# Patient Record
Sex: Male | Born: 1950 | Race: White | Hispanic: No | Smoking: Current every day smoker
Health system: Southern US, Community
[De-identification: ages and names within clinical notes are randomized; demographics above are authoritative.]

## PROBLEM LIST (undated history)

## (undated) DIAGNOSIS — M1990 Unspecified osteoarthritis, unspecified site: Secondary | ICD-10-CM

## (undated) DIAGNOSIS — K5903 Drug induced constipation: Secondary | ICD-10-CM

## (undated) HISTORY — PX: COLONOSCOPY W/ POLYPECTOMY: SHX1380

## (undated) HISTORY — PX: FRACTURE SURGERY: SHX138

---

## 1987-05-14 HISTORY — PX: VARICOCELE EXCISION: SUR582

## 2004-03-17 ENCOUNTER — Emergency Department (HOSPITAL_COMMUNITY): Admission: EM | Admit: 2004-03-17 | Discharge: 2004-03-17 | Payer: Self-pay | Admitting: Emergency Medicine

## 2015-01-02 ENCOUNTER — Other Ambulatory Visit: Payer: Self-pay | Admitting: Orthopaedic Surgery

## 2015-01-02 DIAGNOSIS — M25552 Pain in left hip: Secondary | ICD-10-CM

## 2015-01-13 ENCOUNTER — Ambulatory Visit
Admission: RE | Admit: 2015-01-13 | Discharge: 2015-01-13 | Disposition: A | Discharge status: Home / Self Care | Payer: BLUE CROSS/BLUE SHIELD | Source: Ambulatory Visit | Attending: Orthopaedic Surgery | Admitting: Orthopaedic Surgery

## 2015-01-13 DIAGNOSIS — M25552 Pain in left hip: Secondary | ICD-10-CM

## 2015-01-13 MED ORDER — IOHEXOL 180 MG/ML  SOLN
15.0000 mL | Freq: Once | INTRAMUSCULAR | Status: DC | PRN
  Administered 2015-01-13: 12 mL via INTRA_ARTICULAR

## 2015-02-09 NOTE — Pre-Procedure Instructions (Signed)
Garrett Brown.  02/09/2015     Your procedure is scheduled on : Tuesday February 21, 2015 at 10:15 AM.  Report to [facility] Admitting at 8:15 A.M.  Call this number if you have problems the morning of surgery: [phone]    Remember:  Do not eat food or drink liquids after midnight.  Take these medicines the morning of surgery with A SIP OF WATER : Hydrocodone if needed   Stop taking any vitamins, herbal medications, Ibuprofen, Advil, Motrin, Aleve, Naproxen etc on Tuesday October 4th   Do not wear jewelry.  Do not wear lotions, powders, or cologne.    Men may shave face and neck.  Do not bring valuables to the hospital.  [facility] is not responsible for any belongings or valuables.  Contacts, dentures or bridgework may not be worn into surgery.  Leave your suitcase in the car.  After surgery it may be brought to your room.  For patients admitted to the hospital, discharge time will be determined by your treatment team.  Patients discharged the day of surgery will not be allowed to drive home.   Name and phone number of your driver:    Special instructions:  Shower using CHG soap the night before and the morning of your surgery  Please read over the following fact sheets that you were given. Pain Booklet, Coughing and Deep Breathing, Blood Transfusion Information, Total Joint Packet, MRSA Information and Surgical Site Infection Prevention

## 2015-02-10 ENCOUNTER — Encounter (HOSPITAL_COMMUNITY): Payer: Self-pay

## 2015-02-10 ENCOUNTER — Encounter (HOSPITAL_COMMUNITY)
Admission: RE | Admit: 2015-02-10 | Discharge: 2015-02-10 | Disposition: A | Discharge status: Home / Self Care | Payer: BLUE CROSS/BLUE SHIELD | Source: Ambulatory Visit | Attending: Orthopaedic Surgery | Admitting: Orthopaedic Surgery

## 2015-02-10 ENCOUNTER — Encounter (HOSPITAL_COMMUNITY)
Admission: RE | Admit: 2015-02-10 | Discharge: 2015-02-10 | Disposition: A | Discharge status: Home / Self Care | Payer: BLUE CROSS/BLUE SHIELD | Source: Ambulatory Visit | Attending: Orthopedic Surgery | Admitting: Orthopedic Surgery

## 2015-02-10 DIAGNOSIS — M1612 Unilateral primary osteoarthritis, left hip: Secondary | ICD-10-CM | POA: Insufficient documentation

## 2015-02-10 DIAGNOSIS — Z01818 Encounter for other preprocedural examination: Secondary | ICD-10-CM | POA: Insufficient documentation

## 2015-02-10 HISTORY — DX: Drug induced constipation: K59.03

## 2015-02-10 HISTORY — DX: Unspecified osteoarthritis, unspecified site: M19.90

## 2015-02-10 LAB — CBC WITH DIFFERENTIAL/PLATELET
BASOS ABS: 0.1 10*3/uL (ref 0.0–0.1)
BASOS PCT: 0 %
EOS PCT: 2 %
Eosinophils Absolute: 0.3 10*3/uL (ref 0.0–0.7)
HEMATOCRIT: 44.3 % (ref 39.0–52.0)
Hemoglobin: 14.7 g/dL (ref 13.0–17.0)
Lymphocytes Relative: 24 %
Lymphs Abs: 3.2 10*3/uL (ref 0.7–4.0)
MCH: 30.2 pg (ref 26.0–34.0)
MCHC: 33.2 g/dL (ref 30.0–36.0)
MCV: 91.2 fL (ref 78.0–100.0)
MONO ABS: 0.9 10*3/uL (ref 0.1–1.0)
Monocytes Relative: 6 %
NEUTROS ABS: 9.1 10*3/uL — AB (ref 1.7–7.7)
Neutrophils Relative %: 68 %
PLATELETS: 284 10*3/uL (ref 150–400)
RBC: 4.86 MIL/uL (ref 4.22–5.81)
RDW: 14.6 % (ref 11.5–15.5)
WBC: 13.4 10*3/uL — AB (ref 4.0–10.5)

## 2015-02-10 LAB — TYPE AND SCREEN
ABO/RH(D): A POS
ANTIBODY SCREEN: NEGATIVE

## 2015-02-10 LAB — COMPREHENSIVE METABOLIC PANEL
ALBUMIN: 4.3 g/dL (ref 3.5–5.0)
ALT: 15 U/L — AB (ref 17–63)
AST: 14 U/L — AB (ref 15–41)
Alkaline Phosphatase: 54 U/L (ref 38–126)
Anion gap: 9 (ref 5–15)
BUN: 14 mg/dL (ref 6–20)
CHLORIDE: 105 mmol/L (ref 101–111)
CO2: 27 mmol/L (ref 22–32)
Calcium: 9.7 mg/dL (ref 8.9–10.3)
Creatinine, Ser: 0.88 mg/dL (ref 0.61–1.24)
GFR calc Af Amer: >60 mL/min (ref >60)
GFR calc non Af Amer: >60 mL/min (ref >60)
GLUCOSE: 109 mg/dL — AB (ref 65–99)
POTASSIUM: 4.1 mmol/L (ref 3.5–5.1)
Sodium: 141 mmol/L (ref 135–145)
Total Bilirubin: 0.6 mg/dL (ref 0.3–1.2)
Total Protein: 7 g/dL (ref 6.5–8.1)

## 2015-02-10 LAB — SURGICAL PCR SCREEN
MRSA, PCR: NEGATIVE
STAPHYLOCOCCUS AUREUS: NEGATIVE

## 2015-02-10 LAB — PROTIME-INR
INR: 1.12 (ref 0.00–1.49)
Prothrombin Time: 14.6 seconds (ref 11.6–15.2)

## 2015-02-10 LAB — ABO/RH: ABO/RH(D): A POS

## 2015-02-10 LAB — APTT: APTT: 30 s (ref 24–37)

## 2015-02-10 NOTE — Progress Notes (Signed)
Patient informed Nurse that he does not have a PCP  Patient had a stress test a long time ago (greater than five years). Nurse inquired about reasoning for having a stress test, and patient stated "I came to the hospital because I thought I was having a heart attack, and they ran a bunch of tests on me....but it turned out I had some how strained my esophagus.....and man did it hurt!"   Patient denied having any cardiac or pulmonary issues

## 2015-02-16 NOTE — H&P (Signed)
CHIEF COMPLAINT:  Painful left hip.   HISTORY OF PRESENT ILLNESS:  Garrett Brown is a very pleasant 64 year old male who was seen today for evaluation of his left hip.  He first had onset of pain back between late May and early June of left hip pain without any history of injury or trauma.  It had become progressive and he had difficulty with ambulation as well as sleeping.  He was unable to find a comfortable position.  The pain was localized mainly in the superior left groin area and along the lateral aspect of his hip.  At that time he denied any back pain or any thigh, knee, or even leg pain.  He had tried oxycodone that he had from a while ago and that did not really have much help at that time, as well as a muscle relaxer.  He has tried Aleve as well initially.  He was seen on November 28, 2014 and at that time films were obtained revealing about 25% uncoverage of his hip with some degenerative changes of the hip joint.  There was some osteophytes noted both superiorly and inferiorly with possible avascular necrosis.  He did have an MRI scan performed on January 13, 2015 which revealed advanced degenerative changes involving both hips, left greater than right.  There was probable inflammatory arthropathy involving the left hip with severe synovitis.  There was also marked edema like signal abnormality in the surrounding hip and pelvic musculature, maybe associated with myositis or possible muscle injuries.  He was scheduled for a corticosteroid injection to the left hip by Dr. Alvester Morin and that was accomplished on December 12, 2014.  He states that at that time he was better for a short period of time, but by January 17, 2015 the pain had returned and perhaps it was worse.  He has now gotten to the point where he has to use a cane for ambulation.  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  He is now having marked difficulty with sleeping, ambulating,  and just doing activities of daily living.  He is quite limited because of this pain and discomfort.  He is seen today for evaluation.   PAST SURGICAL HISTORY:  A varicocelectomy in the 1980s.   MEDICATIONS:  Hydrocodone p.r.n. and Aleve p.r.n.    ALLERGIES:  None known.   REVIEW OF SYSTEMS:  A 14-point review of systems is unremarkable except for dentures.  He does have shortness of breath with exercise at times.   FAMILY HISTORY:  Positive for a mother who is deceased at age 79 and she had dementia and Alzheimer.  Father died at age 52 from cardiac issues.  He had heart disease and a myocardial infarction in the past as well as strokes.  He did have bladder cancer, but that was not one of the causes of his death.  He does have 2 brothers, age 32 and 54 who are living.   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  He still smokes cigarettes, about 1/2 pack per day and he smoked at maximum 2 packs per day total time of 25 years.  He does still socially drink.   PHYSICAL EXAMINATION:  Reveals a very pleasant 64 year old white male, well developed, well nourished.  Alert, cooperative, in moderate distress secondary to left hip and groin pain.  Height is 5 feet 11 inches,  weight 187, BMI 26.1.   Vital signs reveal a temperature of 97, pulse 72, respirations 16, blood pressure 138/84.   Head:  Normocephalic. Eyes:  Pupils equal, round, and reactive to light and accommodation with extraocular movements intact. Ears, Nose, and Throat:  Benign. Chest:  Good expansion. Lungs:  Essentially clear with decreased breath sounds bilaterally. Cardiac:  Regular rate and rhythm.  Distal heart sounds.  No murmurs noted. Pulses:  Bilaterally 1+ and symmetric in the lower extremities. Abdomen:  Scaphoid, soft, nontender.  No masses palpable.  Normal bowel sounds present. Genital, Rectal, and Breasts:  Not indicated for a normal orthopedic evaluation. CNS:  Oriented x3.   Cranial nerves II-XII grossly intact. Musculoskeletal:  He has very little internal rotation of the hip and only about 10 degrees of external rotation of the left hip.  I can only get him to about 85-90 degrees before he has pain that he cannot move any further in flexion of the hip.     RADIOGRAPHS:  Reveal OA of the left femoral acetabular joint.  He does have some calcification periarticularly.  He does have some cystic changes that may be noted, even in the femoral neck.   CLINICAL IMPRESSION:   1.  End-stage OA of the left hip. 2.  Cigarette smoker.   RECOMMENDATIONS:  At this time, I have reviewed his entire history and it is felt that he is a candidate for a left total hip arthroplasty.  Procedure, risks, and benefits were fully explained to the patient and he is understanding.  Therefore, we will proceed with the total hip replacement in the very near future.  No Changes   [name]. Aleda Grana [facility] [phone]  02/20/2015 3:36 PM

## 2015-02-21 ENCOUNTER — Inpatient Hospital Stay (HOSPITAL_COMMUNITY)
Admission: RE | Admit: 2015-02-21 | Discharge: 2015-02-23 | DRG: 470 | Disposition: A | Discharge status: Home Health | Payer: BLUE CROSS/BLUE SHIELD | Source: Ambulatory Visit | Attending: Orthopaedic Surgery | Admitting: Orthopaedic Surgery

## 2015-02-21 ENCOUNTER — Inpatient Hospital Stay (HOSPITAL_COMMUNITY): Payer: BLUE CROSS/BLUE SHIELD

## 2015-02-21 ENCOUNTER — Encounter (HOSPITAL_COMMUNITY)
Admission: RE | Disposition: A | Discharge status: Home Health | Payer: Self-pay | Source: Ambulatory Visit | Attending: Orthopaedic Surgery

## 2015-02-21 ENCOUNTER — Inpatient Hospital Stay (HOSPITAL_COMMUNITY): Payer: BLUE CROSS/BLUE SHIELD | Admitting: Anesthesiology

## 2015-02-21 ENCOUNTER — Encounter (HOSPITAL_COMMUNITY): Payer: Self-pay | Admitting: *Deleted

## 2015-02-21 DIAGNOSIS — Z96649 Presence of unspecified artificial hip joint: Secondary | ICD-10-CM

## 2015-02-21 DIAGNOSIS — M25552 Pain in left hip: Secondary | ICD-10-CM | POA: Diagnosis present

## 2015-02-21 DIAGNOSIS — F17210 Nicotine dependence, cigarettes, uncomplicated: Secondary | ICD-10-CM | POA: Diagnosis present

## 2015-02-21 DIAGNOSIS — M1612 Unilateral primary osteoarthritis, left hip: Secondary | ICD-10-CM | POA: Diagnosis present

## 2015-02-21 DIAGNOSIS — Z96643 Presence of artificial hip joint, bilateral: Secondary | ICD-10-CM

## 2015-02-21 DIAGNOSIS — Z9889 Other specified postprocedural states: Secondary | ICD-10-CM

## 2015-02-21 HISTORY — PX: TOTAL HIP ARTHROPLASTY: SHX124

## 2015-02-21 LAB — URINALYSIS, ROUTINE W REFLEX MICROSCOPIC
BILIRUBIN URINE: NEGATIVE
Glucose, UA: NEGATIVE mg/dL
HGB URINE DIPSTICK: NEGATIVE
KETONES UR: NEGATIVE mg/dL
Leukocytes, UA: NEGATIVE
NITRITE: NEGATIVE
PH: 7.5 (ref 5.0–8.0)
Protein, ur: NEGATIVE mg/dL
SPECIFIC GRAVITY, URINE: 1.009 (ref 1.005–1.030)
Urobilinogen, UA: 0.2 mg/dL (ref 0.0–1.0)

## 2015-02-21 SURGERY — ARTHROPLASTY, HIP, TOTAL,POSTERIOR APPROACH
Anesthesia: Monitor Anesthesia Care | Site: Hip | Laterality: Left

## 2015-02-21 MED ORDER — OXYCODONE HCL 5 MG PO TABS
ORAL_TABLET | ORAL | Status: AC
  Filled 2015-02-21: qty 1

## 2015-02-21 MED ORDER — STERILE WATER FOR INJECTION IJ SOLN
INTRAMUSCULAR | Status: AC
  Filled 2015-02-21: qty 10

## 2015-02-21 MED ORDER — LACTATED RINGERS IV SOLN
INTRAVENOUS | Status: DC | PRN
  Administered 2015-02-21 (×2): via INTRAVENOUS

## 2015-02-21 MED ORDER — BUPIVACAINE-EPINEPHRINE (PF) 0.25% -1:200000 IJ SOLN
INTRAMUSCULAR | Status: AC
  Filled 2015-02-21: qty 30

## 2015-02-21 MED ORDER — HYDROMORPHONE HCL 1 MG/ML IJ SOLN
0.5000 mg | INTRAMUSCULAR | Status: DC | PRN
  Administered 2015-02-21 – 2015-02-22 (×4): 1 mg via INTRAVENOUS
  Filled 2015-02-21 (×4): qty 1

## 2015-02-21 MED ORDER — TRANEXAMIC ACID 1000 MG/10ML IV SOLN
2000.0000 mg | INTRAVENOUS | Status: DC | PRN
  Administered 2015-02-21: 2000 mg via TOPICAL

## 2015-02-21 MED ORDER — CEFAZOLIN SODIUM-DEXTROSE 2-3 GM-% IV SOLR
2.0000 g | Freq: Four times a day (QID) | INTRAVENOUS | Status: AC
  Administered 2015-02-21: 2 g via INTRAVENOUS
  Filled 2015-02-21 (×2): qty 50

## 2015-02-21 MED ORDER — CHLORHEXIDINE GLUCONATE 4 % EX LIQD: 60.0000 mL | Freq: Once | CUTANEOUS | Status: DC

## 2015-02-21 MED ORDER — HYDROMORPHONE HCL 1 MG/ML IJ SOLN
0.2500 mg | INTRAMUSCULAR | Status: DC | PRN
  Administered 2015-02-21 (×4): 0.5 mg via INTRAVENOUS

## 2015-02-21 MED ORDER — MIDAZOLAM HCL 2 MG/2ML IJ SOLN
INTRAMUSCULAR | Status: AC
  Filled 2015-02-21: qty 4

## 2015-02-21 MED ORDER — LIDOCAINE HCL (CARDIAC) 20 MG/ML IV SOLN
INTRAVENOUS | Status: AC
  Filled 2015-02-21: qty 10

## 2015-02-21 MED ORDER — ONDANSETRON HCL 4 MG/2ML IJ SOLN
INTRAMUSCULAR | Status: AC
  Filled 2015-02-21: qty 2

## 2015-02-21 MED ORDER — ACETAMINOPHEN 10 MG/ML IV SOLN
1000.0000 mg | Freq: Once | INTRAVENOUS | Status: AC
  Administered 2015-02-21: 1000 mg via INTRAVENOUS
  Filled 2015-02-21: qty 100

## 2015-02-21 MED ORDER — CEFAZOLIN SODIUM-DEXTROSE 2-3 GM-% IV SOLR
2.0000 g | INTRAVENOUS | Status: AC
  Administered 2015-02-21: 2 g via INTRAVENOUS

## 2015-02-21 MED ORDER — OXYCODONE HCL 5 MG PO TABS
5.0000 mg | ORAL_TABLET | ORAL | Status: DC | PRN
  Administered 2015-02-21 – 2015-02-23 (×7): 10 mg via ORAL
  Filled 2015-02-21 (×7): qty 2

## 2015-02-21 MED ORDER — FENTANYL CITRATE (PF) 250 MCG/5ML IJ SOLN
INTRAMUSCULAR | Status: AC
  Filled 2015-02-21: qty 5

## 2015-02-21 MED ORDER — TRANEXAMIC ACID 1000 MG/10ML IV SOLN
2000.0000 mg | INTRAVENOUS | Status: DC
  Filled 2015-02-21: qty 20

## 2015-02-21 MED ORDER — DIPHENHYDRAMINE HCL 12.5 MG/5ML PO ELIX: 12.5000 mg | ORAL_SOLUTION | ORAL | Status: DC | PRN

## 2015-02-21 MED ORDER — MIDAZOLAM HCL 5 MG/5ML IJ SOLN
INTRAMUSCULAR | Status: DC | PRN
  Administered 2015-02-21: 2 mg via INTRAVENOUS

## 2015-02-21 MED ORDER — ALBUMIN HUMAN 5 % IV SOLN
INTRAVENOUS | Status: DC | PRN
  Administered 2015-02-21: 12:00:00 via INTRAVENOUS

## 2015-02-21 MED ORDER — LACTATED RINGERS IV SOLN
INTRAVENOUS | Status: DC
  Administered 2015-02-21: 08:00:00 via INTRAVENOUS

## 2015-02-21 MED ORDER — BUPIVACAINE-EPINEPHRINE (PF) 0.25% -1:200000 IJ SOLN
INTRAMUSCULAR | Status: DC | PRN
  Administered 2015-02-21: 20 mL

## 2015-02-21 MED ORDER — KETOROLAC TROMETHAMINE 15 MG/ML IJ SOLN
15.0000 mg | Freq: Four times a day (QID) | INTRAMUSCULAR | Status: AC
  Administered 2015-02-21 – 2015-02-22 (×3): 15 mg via INTRAVENOUS
  Filled 2015-02-21 (×3): qty 1

## 2015-02-21 MED ORDER — OXYCODONE HCL 5 MG PO TABS
5.0000 mg | ORAL_TABLET | Freq: Once | ORAL | Status: AC | PRN
  Administered 2015-02-21: 5 mg via ORAL

## 2015-02-21 MED ORDER — RIVAROXABAN 10 MG PO TABS
10.0000 mg | ORAL_TABLET | Freq: Every day | ORAL | Status: DC
  Administered 2015-02-22 – 2015-02-23 (×2): 10 mg via ORAL
  Filled 2015-02-21 (×2): qty 1

## 2015-02-21 MED ORDER — POLYETHYLENE GLYCOL 3350 17 G PO PACK: 17.0000 g | PACK | Freq: Every day | ORAL | Status: DC | PRN

## 2015-02-21 MED ORDER — FENTANYL CITRATE (PF) 100 MCG/2ML IJ SOLN
INTRAMUSCULAR | Status: DC | PRN
  Administered 2015-02-21 (×3): 50 ug via INTRAVENOUS
  Administered 2015-02-21: 100 ug via INTRAVENOUS

## 2015-02-21 MED ORDER — ONDANSETRON HCL 4 MG PO TABS: 4.0000 mg | ORAL_TABLET | Freq: Four times a day (QID) | ORAL | Status: DC | PRN

## 2015-02-21 MED ORDER — EPHEDRINE SULFATE 50 MG/ML IJ SOLN
INTRAMUSCULAR | Status: DC | PRN
  Administered 2015-02-21: 5 mg via INTRAVENOUS
  Administered 2015-02-21 (×2): 10 mg via INTRAVENOUS
  Administered 2015-02-21: 5 mg via INTRAVENOUS

## 2015-02-21 MED ORDER — ARTIFICIAL TEARS OP OINT
TOPICAL_OINTMENT | OPHTHALMIC | Status: AC
  Filled 2015-02-21: qty 3.5

## 2015-02-21 MED ORDER — KETOROLAC TROMETHAMINE 15 MG/ML IJ SOLN
INTRAMUSCULAR | Status: AC
  Filled 2015-02-21: qty 1

## 2015-02-21 MED ORDER — SODIUM CHLORIDE 0.9 % IR SOLN
Status: DC | PRN
  Administered 2015-02-21: 1000 mL

## 2015-02-21 MED ORDER — SODIUM CHLORIDE 0.9 % IV SOLN
75.0000 mL/h | INTRAVENOUS | Status: DC
Start: 2015-02-21 — End: 2015-02-22
  Administered 2015-02-21 – 2015-02-22 (×2): 75 mL/h via INTRAVENOUS

## 2015-02-21 MED ORDER — PHENOL 1.4 % MT LIQD: 1.0000 | OROMUCOSAL | Status: DC | PRN

## 2015-02-21 MED ORDER — EPHEDRINE SULFATE 50 MG/ML IJ SOLN
INTRAMUSCULAR | Status: AC
  Filled 2015-02-21: qty 2

## 2015-02-21 MED ORDER — METOCLOPRAMIDE HCL 5 MG/ML IJ SOLN: 5.0000 mg | Freq: Three times a day (TID) | INTRAMUSCULAR | Status: DC | PRN

## 2015-02-21 MED ORDER — METHOCARBAMOL 500 MG PO TABS
500.0000 mg | ORAL_TABLET | Freq: Four times a day (QID) | ORAL | Status: DC | PRN
  Administered 2015-02-21: 500 mg via ORAL

## 2015-02-21 MED ORDER — SODIUM CHLORIDE 0.9 % IJ SOLN
INTRAMUSCULAR | Status: AC
  Filled 2015-02-21: qty 20

## 2015-02-21 MED ORDER — DOCUSATE SODIUM 100 MG PO CAPS
100.0000 mg | ORAL_CAPSULE | Freq: Two times a day (BID) | ORAL | Status: DC
  Administered 2015-02-21 – 2015-02-23 (×4): 100 mg via ORAL
  Filled 2015-02-21 (×4): qty 1

## 2015-02-21 MED ORDER — PROPOFOL 500 MG/50ML IV EMUL
INTRAVENOUS | Status: DC | PRN
  Administered 2015-02-21: 50 ug/kg/min via INTRAVENOUS

## 2015-02-21 MED ORDER — BISACODYL 10 MG RE SUPP: 10.0000 mg | Freq: Every day | RECTAL | Status: DC | PRN

## 2015-02-21 MED ORDER — VECURONIUM BROMIDE 10 MG IV SOLR
INTRAVENOUS | Status: AC
  Filled 2015-02-21: qty 10

## 2015-02-21 MED ORDER — METOCLOPRAMIDE HCL 5 MG PO TABS: 5.0000 mg | ORAL_TABLET | Freq: Three times a day (TID) | ORAL | Status: DC | PRN

## 2015-02-21 MED ORDER — MENTHOL 3 MG MT LOZG: 1.0000 | LOZENGE | OROMUCOSAL | Status: DC | PRN

## 2015-02-21 MED ORDER — MAGNESIUM CITRATE PO SOLN: 1.0000 | Freq: Once | ORAL | Status: DC | PRN

## 2015-02-21 MED ORDER — LIDOCAINE HCL (CARDIAC) 20 MG/ML IV SOLN
INTRAVENOUS | Status: DC | PRN
  Administered 2015-02-21: 50 mg via INTRAVENOUS

## 2015-02-21 MED ORDER — ALUM & MAG HYDROXIDE-SIMETH 200-200-20 MG/5ML PO SUSP: 30.0000 mL | ORAL | Status: DC | PRN

## 2015-02-21 MED ORDER — SODIUM CHLORIDE 0.9 % IV SOLN: INTRAVENOUS | Status: DC

## 2015-02-21 MED ORDER — ONDANSETRON HCL 4 MG/2ML IJ SOLN: 4.0000 mg | Freq: Once | INTRAMUSCULAR | Status: DC | PRN

## 2015-02-21 MED ORDER — ROCURONIUM BROMIDE 50 MG/5ML IV SOLN
INTRAVENOUS | Status: AC
  Filled 2015-02-21: qty 1

## 2015-02-21 MED ORDER — METHOCARBAMOL 500 MG PO TABS
ORAL_TABLET | ORAL | Status: AC
Start: 2015-02-21 — End: 2015-02-22
  Filled 2015-02-21: qty 1

## 2015-02-21 MED ORDER — METHOCARBAMOL 1000 MG/10ML IJ SOLN
500.0000 mg | Freq: Four times a day (QID) | INTRAVENOUS | Status: DC | PRN
  Filled 2015-02-21: qty 5

## 2015-02-21 MED ORDER — ONDANSETRON HCL 4 MG/2ML IJ SOLN: 4.0000 mg | Freq: Four times a day (QID) | INTRAMUSCULAR | Status: DC | PRN

## 2015-02-21 MED ORDER — HYDROMORPHONE HCL 1 MG/ML IJ SOLN
INTRAMUSCULAR | Status: AC
  Filled 2015-02-21: qty 1

## 2015-02-21 MED ORDER — CEFAZOLIN SODIUM-DEXTROSE 2-3 GM-% IV SOLR
INTRAVENOUS | Status: AC
  Filled 2015-02-21: qty 50

## 2015-02-21 MED ORDER — ACETAMINOPHEN 10 MG/ML IV SOLN
1000.0000 mg | Freq: Four times a day (QID) | INTRAVENOUS | Status: AC
  Administered 2015-02-21 – 2015-02-22 (×4): 1000 mg via INTRAVENOUS
  Filled 2015-02-21 (×4): qty 100

## 2015-02-21 MED ORDER — HYDROMORPHONE HCL 1 MG/ML IJ SOLN
INTRAMUSCULAR | Status: AC
Start: 2015-02-21 — End: 2015-02-22
  Filled 2015-02-21: qty 1

## 2015-02-21 MED ORDER — OXYCODONE HCL 5 MG/5ML PO SOLN: 5.0000 mg | Freq: Once | ORAL | Status: AC | PRN

## 2015-02-21 SURGICAL SUPPLY — 54 items
BLADE SAW SAG 73X25 THK (BLADE) ×2
BLADE SAW SGTL 73X25 THK (BLADE) ×1 IMPLANT
BRUSH FEMORAL CANAL (MISCELLANEOUS) IMPLANT
CAPT HIP TOTAL 2 ×2 IMPLANT
COVER SURGICAL LIGHT HANDLE (MISCELLANEOUS) ×3 IMPLANT
DRAPE INCISE IOBAN 66X45 STRL (DRAPES) IMPLANT
DRAPE ORTHO SPLIT 77X108 STRL (DRAPES) ×6
DRAPE SURG ORHT 6 SPLT 77X108 (DRAPES) ×2 IMPLANT
DRSG MEPILEX BORDER 4X12 (GAUZE/BANDAGES/DRESSINGS) ×3 IMPLANT
DURAPREP 26ML APPLICATOR (WOUND CARE) ×6 IMPLANT
ELECT BLADE 6.5 EXT (BLADE) IMPLANT
ELECT REM PT RETURN 9FT ADLT (ELECTROSURGICAL) ×3
ELECTRODE REM PT RTRN 9FT ADLT (ELECTROSURGICAL) ×1 IMPLANT
EVACUATOR 1/8 PVC DRAIN (DRAIN) IMPLANT
FACESHIELD WRAPAROUND (MASK) ×6 IMPLANT
FACESHIELD WRAPAROUND OR TEAM (MASK) ×3 IMPLANT
GLOVE BIOGEL PI IND STRL 8 (GLOVE) ×2 IMPLANT
GLOVE BIOGEL PI IND STRL 8.5 (GLOVE) ×1 IMPLANT
GLOVE BIOGEL PI INDICATOR 8 (GLOVE) ×4
GLOVE BIOGEL PI INDICATOR 8.5 (GLOVE) ×2
GLOVE ECLIPSE 8.0 STRL XLNG CF (GLOVE) ×6 IMPLANT
GLOVE SURG ORTHO 8.5 STRL (GLOVE) ×6 IMPLANT
GOWN STRL REUS W/ TWL LRG LVL3 (GOWN DISPOSABLE) ×2 IMPLANT
GOWN STRL REUS W/TWL 2XL LVL3 (GOWN DISPOSABLE) ×6 IMPLANT
GOWN STRL REUS W/TWL LRG LVL3 (GOWN DISPOSABLE) ×6
HANDPIECE INTERPULSE COAX TIP (DISPOSABLE)
IMMOBILIZER KNEE 20 (SOFTGOODS) IMPLANT
IMMOBILIZER KNEE 22 UNIV (SOFTGOODS) ×3 IMPLANT
KIT BASIN OR (CUSTOM PROCEDURE TRAY) ×3 IMPLANT
KIT ROOM TURNOVER OR (KITS) ×3 IMPLANT
MANIFOLD NEPTUNE II (INSTRUMENTS) ×3 IMPLANT
NEEDLE 22X1 1/2 (OR ONLY) (NEEDLE) ×3 IMPLANT
NS IRRIG 1000ML POUR BTL (IV SOLUTION) ×3 IMPLANT
PACK TOTAL JOINT (CUSTOM PROCEDURE TRAY) ×3 IMPLANT
PACK UNIVERSAL I (CUSTOM PROCEDURE TRAY) ×1 IMPLANT
PAD ARMBOARD 7.5X6 YLW CONV (MISCELLANEOUS) ×3 IMPLANT
PRESSURIZER FEMORAL UNIV (MISCELLANEOUS) IMPLANT
SET HNDPC FAN SPRY TIP SCT (DISPOSABLE) IMPLANT
STAPLER VISISTAT 35W (STAPLE) ×2 IMPLANT
SUCTION FRAZIER TIP 10 FR DISP (SUCTIONS) ×3 IMPLANT
SUT BONE WAX W31G (SUTURE) IMPLANT
SUT ETHIBOND NAB CT1 #1 30IN (SUTURE) ×9 IMPLANT
SUT MNCRL AB 3-0 PS2 18 (SUTURE) ×5 IMPLANT
SUT VIC AB 0 CT1 27 (SUTURE) ×6
SUT VIC AB 0 CT1 27XBRD ANBCTR (SUTURE) ×2 IMPLANT
SUT VIC AB 1 CT1 27 (SUTURE) ×12
SUT VIC AB 1 CT1 27XBRD ANBCTR (SUTURE) ×2 IMPLANT
SUT VIC AB 2-0 CT1 27 (SUTURE) ×3
SUT VIC AB 2-0 CT1 TAPERPNT 27 (SUTURE) ×1 IMPLANT
SYR CONTROL 10ML LL (SYRINGE) ×3 IMPLANT
TOWEL OR 17X24 6PK STRL BLUE (TOWEL DISPOSABLE) ×3 IMPLANT
TOWEL OR 17X26 10 PK STRL BLUE (TOWEL DISPOSABLE) ×3 IMPLANT
TOWER CARTRIDGE SMART MIX (DISPOSABLE) IMPLANT
WRAP KNEE MAXI GEL POST OP (GAUZE/BANDAGES/DRESSINGS) ×2 IMPLANT

## 2015-02-21 NOTE — Op Note (Signed)
PATIENT ID:      Garrett Brown.  MRN:     [id number] DOB/AGE:    December 10, 1950 / 64 y.o.       OPERATIVE REPORT    DATE OF PROCEDURE:  02/21/2015       PREOPERATIVE DIAGNOSIS:PRIMARY, END STAGE   LEFT HIP OSTEOARTHRITIS                                                       Estimated body mass index is 26.02 kg/(m^2) as calculated from the following:   Height as of this encounter: 5' 10.5" (1.791 m).   Weight as of this encounter: 83.462 kg (184 lb).     POSTOPERATIVE DIAGNOSIS:   LEFT HIP OSTEOARTHRITIS-SAME                                                                     Estimated body mass index is 26.02 kg/(m^2) as calculated from the following:   Height as of this encounter: 5' 10.5" (1.791 m).   Weight as of this encounter: 83.462 kg (184 lb).     PROCEDURE:  Procedure(s):LEFT TOTAL LEFT HIP ARTHROPLASTY     SURGEON:  Norlene Campbell, MD    ASSISTANT:   Jacqualine Code, PA-C   (Present and scrubbed throughout the case, critical for assistance with exposure, retraction, instrumentation, and closure.)          ANESTHESIA: spinal and IV sedation     DRAINS: none :      TOURNIQUET TIME: * No tourniquets in log *    COMPLICATIONS:  None   CONDITION:  stable  PROCEDURE IN DETAIL: [id number]   Garrett Brown W 02/21/2015, 12:16 PM

## 2015-02-21 NOTE — Anesthesia Procedure Notes (Addendum)
Procedure Name: MAC Date/Time: 02/21/2015 10:20 AM Performed by: Wray Kearns A Pre-anesthesia Checklist: Patient identified, Timeout performed, Emergency Drugs available, Suction available and Patient being monitored Patient Re-evaluated:Patient Re-evaluated prior to inductionOxygen Delivery Method: Nasal cannula Intubation Type: IV induction Placement Confirmation: positive ETCO2 Dental Injury: Teeth and Oropharynx as per pre-operative assessment    Spinal Patient location during procedure: OR Start time: 02/21/2015 10:40 AM End time: 02/21/2015 10:45 AM Staffing Performed by: anesthesiologist  Preanesthetic Checklist Completed: patient identified, site marked, surgical consent, pre-op evaluation, timeout performed, IV checked and monitors and equipment checked Spinal Block Patient position: right lateral decubitus Prep: ChloraPrep Patient monitoring: heart rate, cardiac monitor, continuous pulse ox and blood pressure Approach: right paramedian Location: L3-4 Injection technique: single-shot Needle Needle type: Tuohy  Needle gauge: 22 G Needle length: 9 cm Assessment Sensory level: T6 Additional Notes 10 mg 0.75% Bupivacaine injected easily

## 2015-02-21 NOTE — Anesthesia Preprocedure Evaluation (Signed)
Anesthesia Evaluation  Patient identified by MRN, date of birth, ID band Patient awake    Reviewed: Allergy & Precautions, NPO status , Patient's Chart, lab work & pertinent test results  Airway Mallampati: II  TM Distance: >3 FB Neck ROM: Full    Dental  (+) Edentulous Upper, Partial Lower, Dental Advisory Given   Pulmonary Current Smoker,     + decreased breath sounds      Cardiovascular  Rhythm:Regular Rate:Normal     Neuro/Psych    GI/Hepatic   Endo/Other    Renal/GU      Musculoskeletal   Abdominal   Peds  Hematology   Anesthesia Other Findings   Reproductive/Obstetrics                             Anesthesia Physical Anesthesia Plan  ASA: II  Anesthesia Plan: MAC and Spinal   Post-op Pain Management:    Induction: Intravenous  Airway Management Planned: Natural Airway and Simple Face Mask  Additional Equipment:   Intra-op Plan:   Post-operative Plan:   Informed Consent: I have reviewed the patients History and Physical, chart, labs and discussed the procedure including the risks, benefits and alternatives for the proposed anesthesia with the patient or authorized representative who has indicated his/her understanding and acceptance.   Dental advisory given  Plan Discussed with: CRNA and Anesthesiologist  Anesthesia Plan Comments: (DJD L. Hip Smoker/COPD  Plan SAB)        Anesthesia Quick Evaluation

## 2015-02-21 NOTE — Anesthesia Postprocedure Evaluation (Signed)
  Anesthesia Post-op Note  Patient: Garrett Brown.  Procedure(s) Performed: Procedure(s): TOTAL LEFT HIP ARTHROPLASTY (Left)  Patient Location: PACU  Anesthesia Type:Spinal  Level of Consciousness: awake, alert  and oriented  Airway and Oxygen Therapy: Patient Spontanous Breathing and Patient connected to nasal cannula oxygen  Post-op Pain: none  Post-op Assessment: Post-op Vital signs reviewed, Patient's Cardiovascular Status Stable, Respiratory Function Stable and Pain level controlled LLE Motor Response: Purposeful movement, Responds to commands LLE Sensation: Full sensation RLE Motor Response: Purposeful movement, Responds to commands RLE Sensation: Full sensation L Sensory Level: S1-Sole of foot, small toes R Sensory Level: S1-Sole of foot, small toes  Post-op Vital Signs: stable  Last Vitals:  Filed Vitals:   02/21/15 1345  BP:   Pulse: 60  Temp:   Resp: 21    Complications: No apparent anesthesia complications

## 2015-02-21 NOTE — Progress Notes (Signed)
Care of pt assumed by MA Collene Mares RN from Devota Pace RN

## 2015-02-21 NOTE — Care Management (Signed)
Utilization review completed. Narelle Schoening, RN Case Manager [phone]. 

## 2015-02-21 NOTE — Addendum Note (Signed)
Addendum  created 02/21/15 1358 by Elisabeth Most, CRNA   Modules edited: Anesthesia Medication Administration

## 2015-02-21 NOTE — Progress Notes (Signed)
Report received from shirley gregson rn as caregiver

## 2015-02-21 NOTE — H&P (Signed)
  The recent History & Physical has been reviewed. I have personally examined the patient today. There is no interval change to the documented History & Physical. The patient would like to proceed with the procedure.  Norlene Campbell W 02/21/2015,  9:46 AM

## 2015-02-21 NOTE — Transfer of Care (Signed)
Immediate Anesthesia Transfer of Care Note  Patient: Garrett Brown.  Procedure(s) Performed: Procedure(s): TOTAL LEFT HIP ARTHROPLASTY (Left)  Patient Location: PACU  Anesthesia Type:Spinal  Level of Consciousness: awake, alert , oriented, patient cooperative and responds to stimulation  Airway & Oxygen Therapy: Patient Spontanous Breathing  Post-op Assessment: Report given to RN, Post -op Vital signs reviewed and stable, Patient moving all extremities and Patient moving all extremities X 4  Post vital signs: Reviewed and stable  Last Vitals:  Filed Vitals:   02/21/15 0758  BP: 147/90  Pulse: 70  Temp: 36.4 C  Resp: 18    Complications: No apparent anesthesia complications

## 2015-02-21 NOTE — Evaluation (Addendum)
Physical Therapy Evaluation Patient Details Name: Garrett Brown. MRN: [id number] DOB: 02-23-1951 Today's Date: 02/21/2015   History of Present Illness  Patient is a 64 y/o male s/p left THA, posterior approach. No significant PMH.  Clinical Impression  Patient presents with pain and post surgical deficits LLE s/p L THA. Tolerated short distance ambulation with Min guard assist for safety. Educated pt on posterior hip precautions. Instructed pt in exercises. Pt's brother plans to stay with him until Sunday. Will follow acutely to maximize independence and mobility prior to return home.     Follow Up Recommendations Home health PT;Supervision/Assistance - 24 hour    Equipment Recommendations  None recommended by PT    Recommendations for Other Services OT consult     Precautions / Restrictions Precautions Precautions: Posterior Hip Precaution Booklet Issued: No Precaution Comments: Reviewed precautions. Required Braces or Orthoses: Knee Immobilizer - Left Restrictions Weight Bearing Restrictions: Yes Other Position/Activity Restrictions: WBAT LLE      Mobility  Bed Mobility Overal bed mobility: Needs Assistance Bed Mobility: Supine to Sit     Supine to sit: Supervision;HOB elevated     General bed mobility comments: use of rail for support. + dizziness - resolved.  Transfers Overall transfer level: Needs assistance Equipment used: Rolling walker (2 wheeled) Transfers: Sit to/from Stand Sit to Stand: Min guard         General transfer comment: Min guard to boost from EOB with cues for hand placement. Transferred to chair post ambulation bout.  Ambulation/Gait Ambulation/Gait assistance: Min guard Ambulation Distance (Feet): 8 Feet Assistive device: Rolling walker (2 wheeled) Gait Pattern/deviations: Step-to pattern;Decreased stance time - left;Decreased step length - right Gait velocity: decreased   General Gait Details: Slow, steady gait. Cues for RW  management.  Stairs            Wheelchair Mobility    Modified Rankin (Stroke Patients Only)       Balance Overall balance assessment: Needs assistance Sitting-balance support: Feet supported;No upper extremity supported Sitting balance-Leahy Scale: Good     Standing balance support: During functional activity Standing balance-Leahy Scale: Poor Standing balance comment: Relient on RW for support.                             Pertinent Vitals/Pain Pain Assessment: Faces Faces Pain Scale: Hurts even more Pain Location: left hip Pain Descriptors / Indicators: Sore Pain Intervention(s): Monitored during session;Repositioned    Home Living Family/patient expects to be discharged to:: Private residence Living Arrangements: Alone Available Help at Discharge: Family;Available PRN/intermittently (Brother will be staying with him until Sunday) Type of Home: House Home Access: Stairs to enter Entrance Stairs-Rails: None Entrance Stairs-Number of Steps: 2 Home Layout: Two level (Able to live on main level - ordered a hospital bed?) Home Equipment: Cane - single point;Bedside commode;Walker - 2 wheels      Prior Function Level of Independence: Independent with assistive device(s)         Comments: 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     Hand Dominance        Extremity/Trunk Assessment               Lower Extremity Assessment: LLE deficits/detail   LLE Deficits / Details: Limited AROM/strength secondary to pain and post op.     Communication   Communication: No difficulties  Cognition Arousal/Alertness: Awake/alert Behavior During Therapy: WFL for tasks assessed/performed Overall Cognitive  Status: Within Functional Limits for tasks assessed                      General Comments      Exercises Total Joint Exercises Ankle Circles/Pumps: Both;10 reps;Supine Quad Sets: Both;10 reps;Supine Gluteal Sets: Both;10  reps;Supine      Assessment/Plan    PT Assessment Patient needs continued PT services  PT Diagnosis Difficulty walking;Acute pain   PT Problem List Decreased strength;Pain;Decreased activity tolerance;Decreased balance;Decreased mobility;Decreased knowledge of precautions  PT Treatment Interventions Balance training;Gait training;Stair training;Functional mobility training;Therapeutic activities;Therapeutic exercise;Patient/family education   PT Goals (Current goals can be found in the Care Plan section) Acute Rehab PT Goals Patient Stated Goal: none stated PT Goal Formulation: With patient Time For Goal Achievement: 03/07/15 Potential to Achieve Goals: Fair    Frequency 7X/week   Barriers to discharge Decreased caregiver support Pt lives alone    Co-evaluation               End of Session Equipment Utilized During Treatment: Gait belt Activity Tolerance: Patient tolerated treatment well Patient left: in chair;with call bell/phone within reach Nurse Communication: Mobility status;Precautions         Time: 1610-9604 PT Time Calculation (min) (ACUTE ONLY): 16 min   Charges:   PT Evaluation $Initial PT Evaluation Tier I: 1 Procedure     PT G Codes:        Lendell Gallick A Nariya Neumeyer 02/21/2015, 5:05 PM  Mylo Red, PT, DPT [phone]

## 2015-02-21 NOTE — Progress Notes (Signed)
Urine speci not obtained in PAT instructed pt the need for a speci.  Speci cup left in room and instructed pt to call if he needs to get up.

## 2015-02-22 ENCOUNTER — Encounter (HOSPITAL_COMMUNITY): Payer: Self-pay | Admitting: Orthopaedic Surgery

## 2015-02-22 LAB — CBC
HEMATOCRIT: 36.1 % — AB (ref 39.0–52.0)
HEMOGLOBIN: 12.2 g/dL — AB (ref 13.0–17.0)
MCH: 30.6 pg (ref 26.0–34.0)
MCHC: 33.8 g/dL (ref 30.0–36.0)
MCV: 90.5 fL (ref 78.0–100.0)
Platelets: 222 10*3/uL (ref 150–400)
RBC: 3.99 MIL/uL — ABNORMAL LOW (ref 4.22–5.81)
RDW: 14.4 % (ref 11.5–15.5)
WBC: 11.6 10*3/uL — ABNORMAL HIGH (ref 4.0–10.5)

## 2015-02-22 LAB — BASIC METABOLIC PANEL
Anion gap: 10 (ref 5–15)
BUN: 8 mg/dL (ref 6–20)
CHLORIDE: 99 mmol/L — AB (ref 101–111)
CO2: 28 mmol/L (ref 22–32)
CREATININE: 0.81 mg/dL (ref 0.61–1.24)
Calcium: 8.5 mg/dL — ABNORMAL LOW (ref 8.9–10.3)
GFR calc non Af Amer: >60 mL/min (ref >60)
GLUCOSE: 126 mg/dL — AB (ref 65–99)
Potassium: 3.8 mmol/L (ref 3.5–5.1)
Sodium: 137 mmol/L (ref 135–145)

## 2015-02-22 LAB — URINE CULTURE: CULTURE: NO GROWTH

## 2015-02-22 NOTE — Progress Notes (Signed)
Patient ID: Garrett Villada., male   DOB: 1950/06/11, 64 y.o.   MRN: [id number] PATIENT ID: Garrett Brown.        MRN:  [id number]          DOB/AGE: 1950/08/20 / 64 y.o.    Garrett Campbell, MD   Garrett Code, PA-C [address] [place], [place]  [address]                             [phone]   PROGRESS NOTE  Subjective:  negative for Chest Pain  negative for Shortness of Breath  negative for Nausea/Vomiting   negative for Calf Pain    Tolerating Diet: yes         Patient reports pain as 2 on 0-10 scale.     Comfortable night  Objective: Vital signs in last 24 hours:   Patient Vitals for the past 24 hrs:  BP Temp Temp src Pulse Resp SpO2 Height Weight  02/22/15 0443 111/81 mmHg 98.5 F (36.9 C) Oral 83 18 95 % - -  02/22/15 0010 112/78 mmHg 98.5 F (36.9 C) Oral 77 18 96 % - -  02/21/15 2104 111/73 mmHg 98.2 F (36.8 C) Oral 73 18 98 % - -  02/21/15 1430 (!) 142/81 mmHg 97.9 F (36.6 C) - (!) 57 20 96 % - -  02/21/15 1400 - 97.7 F (36.5 C) - (!) 57 20 96 % - -  02/21/15 1353 (!) 141/86 mmHg - - - - - - -  02/21/15 1345 - - - 60 (!) 21 98 % - -  02/21/15 1338 122/78 mmHg - - - - - - -  02/21/15 1330 - - - (!) 54 19 99 % - -  02/21/15 1323 132/67 mmHg - - - - - - -  02/21/15 1315 125/75 mmHg - - (!) 56 15 99 % - -  02/21/15 1300 125/64 mmHg - - 71 18 99 % - -  02/21/15 1245 130/66 mmHg - - 66 13 100 % - -  02/21/15 1235 130/76 mmHg 97.4 F (36.3 C) - 68 (!) 25 100 % - -  02/21/15 0758 (!) 147/90 mmHg 97.6 F (36.4 C) Oral 70 18 100 % 5' 10.5" (1.791 m) 83.462 kg (184 lb)      Intake/Output from previous day:   10/11 0701 - 10/12 0700 In: 2540 [P.O.:290; I.V.:2000] Out: 3075 [Urine:2625]   Intake/Output this shift:       Intake/Output      10/11 0701 - 10/12 0700 10/12 0701 - 10/13 0700   P.O. 290    I.V. (mL/kg) 2000 (24)    IV Piggyback 250    Total Intake(mL/kg) 2540 (30.4)    Urine (mL/kg/hr) 2625    Blood 450    Total Output 3075     Net -535             LABORATORY DATA: No results for input(s): WBC, HGB, HCT, PLT in the last 168 hours. No results for input(s): NA, K, CL, CO2, BUN, CREATININE, GLUCOSE, CALCIUM in the last 168 hours. Lab Results  Component Value Date   INR 1.12 02/10/2015    Recent Radiographic Studies :  Dg Chest 2 View  02/10/2015  CLINICAL DATA:  Total left hip arthroplasty.  Preop.  Smoker. EXAM: CHEST  2 VIEW COMPARISON:  None. FINDINGS: Heart and mediastinal contours are within normal limits. No focal opacities or effusions.  No acute bony abnormality. Degenerative spurring in the thoracic spine. IMPRESSION: No active cardiopulmonary disease. Electronically Signed   By: Charlett NoseKevin  Dover M.D.   On: 02/10/2015 13:53   Dg Pelvis Portable  02/21/2015  CLINICAL DATA:  Status post left hip replacement. EXAM: PORTABLE PELVIS 1-2 VIEWS COMPARISON:  None. FINDINGS: Total left hip replacement is identified without malalignment. Postsurgical changes including soft tissue air, swelling and skin staples are noted. Degenerative joint changes of the right hip are noted. IMPRESSION: Total left hip replacement without malalignment. Electronically Signed   By: Sherian ReinWei-Chen  Lin M.D.   On: 02/21/2015 13:35   Dg Hip Port Unilat With Pelvis 1v Left  02/21/2015  CLINICAL DATA:  Postoperative left hip total arthroplasty EXAM: DG HIP (WITH OR WITHOUT PELVIS) 1V PORT LEFT COMPARISON:  None. FINDINGS: Single cross-table lateral view of the left hip demonstrate left hip replacement without malalignment. IMPRESSION: Single cross-table lateral view of the left hip demonstrate left hip replacement without malalignment. Electronically Signed   By: Sherian ReinWei-Chen  Lin M.D.   On: 02/21/2015 13:35     Examination:  Brown appearance: alert, cooperative and no distress  Wound Exam: clean, dry, intact   Drainage:  None: wound tissue dry  Motor Exam: EHL, FHL, Anterior Tibial and Posterior Tibial Intact  Sensory Exam: Superficial Peroneal,  Deep Peroneal and Tibial normal  Vascular Exam: Normal  Assessment:    1 Day Post-Op  Procedure(s) (LRB): TOTAL LEFT HIP ARTHROPLASTY (Left)  ADDITIONAL DIAGNOSIS:  Principal Problem:   Primary osteoarthritis of left hip Active Problems:   S/P total hip arthroplasty  no new problems   Plan: Physical Therapy as ordered Weight Bearing as Tolerated (WBAT)  DVT Prophylaxis:  Lovenox, Foot Pumps and TED hose  DISCHARGE PLAN: Home  DISCHARGE NEEDS: HHPT, Walker and 3-in-1 comode seat     OOB with PT,  Foley out, lab pending, KVO IV, post op films with good position of components   Garrett CampbellWHITFIELD, PETER W  02/22/2015 7:22 AM

## 2015-02-22 NOTE — Progress Notes (Signed)
Physical Therapy Treatment Patient Details Name: Garrett Brown. MRN: [id number] DOB: 15-May-1950 Today's Date: 02/22/2015    History of Present Illness Patient is a 64 y/o male s/p left THA, posterior approach. No significant PMH.    PT Comments    Pt is progressing well with mobility, he walked 110' with RW and performed L THA exercises with min A. Reviewed posterior precautions.  Follow Up Recommendations  Home health PT;Supervision/Assistance - 24 hour     Equipment Recommendations  None recommended by PT    Recommendations for Other Services OT consult     Precautions / Restrictions Precautions Precautions: Posterior Hip Precaution Booklet Issued: No Precaution Comments: Reviewed precautions. Pt recalled 2 of 3. Required Braces or Orthoses: Knee Immobilizer - Left Restrictions Weight Bearing Restrictions: Yes Other Position/Activity Restrictions: WBAT LLE    Mobility  Bed Mobility Overal bed mobility: Needs Assistance Bed Mobility: Supine to Sit     Supine to sit: Supervision;HOB elevated     General bed mobility comments: use of rail for support  Transfers Overall transfer level: Needs assistance Equipment used: Rolling walker (2 wheeled) Transfers: Sit to/from Stand Sit to Stand: Min guard         General transfer comment: Min guard to boost from EOB with cues for hand placement.  Ambulation/Gait Ambulation/Gait assistance: Supervision Ambulation Distance (Feet): 110 Feet Assistive device: Rolling walker (2 wheeled) Gait Pattern/deviations: Step-to pattern;Decreased weight shift to left Gait velocity: decreased   General Gait Details: Slow, steady gait. Cues for L heel strike. Decr knee flexion with L swing phase.   Stairs            Wheelchair Mobility    Modified Rankin (Stroke Patients Only)       Balance     Sitting balance-Leahy Scale: Good       Standing balance-Leahy Scale: Poor                       Cognition Arousal/Alertness: Awake/alert Behavior During Therapy: WFL for tasks assessed/performed Overall Cognitive Status: Within Functional Limits for tasks assessed                      Exercises Total Joint Exercises Ankle Circles/Pumps: Both;10 reps;Supine Short Arc Quad: AROM;Left;10 reps;Supine Heel Slides: Left;10 reps;AAROM;Supine Hip ABduction/ADduction: AAROM;Left;10 reps;Supine    General Comments        Pertinent Vitals/Pain Pain Score: 8  Pain Location: L hip with activity Pain Descriptors / Indicators: Sore Pain Intervention(s): Limited activity within patient's tolerance;Monitored during session;Premedicated before session (pt declined ice)    Home Living                      Prior Function            PT Goals (current goals can now be found in the care plan section) Acute Rehab 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 PT Goal Formulation: With patient Time For Goal Achievement: 03/07/15 Potential to Achieve Goals: Fair Progress towards PT goals: Progressing toward goals    Frequency  7X/week    PT Plan Current plan remains appropriate    Co-evaluation             End of Session Equipment Utilized During Treatment: Gait belt Activity Tolerance: Patient tolerated treatment well Patient left: in chair;with call bell/phone within reach     Time: 1152-1207 PT Time Calculation (min) (ACUTE ONLY): 15 min  Charges:  $Gait Training: 8-22 mins                    G Codes:      Tamala SerUhlenberg, Jayanth Szczesniak Kistler 02/22/2015, 12:13 PM [phone]

## 2015-02-22 NOTE — Op Note (Signed)
NAMEBRODIE, CORRELL NO.:  [id number]  MEDICAL RECORD NO.:  [id number]  LOCATION:  5N04C                        FACILITY:  [facility]  PHYSICIAN:  Vonna Kotyk. Hersel Mcmeen, M.D.DATE OF BIRTH:  [birth year]  DATE OF PROCEDURE:  02/21/2015 DATE OF DISCHARGE:                              OPERATIVE REPORT   PREOPERATIVE DIAGNOSIS:  Primary end-stage osteoarthritis, left hip.  POSTOPERATIVE DIAGNOSIS:  Primary end-stage osteoarthritis, left hip.  PROCEDURE:  Left total hip replacement.  SURGEON:  Vonna Kotyk. Durward Fortes, M.D.  ASSISTANT:  Aaron Edelman D. Petrarca, PA-C, who was present throughout the operative procedure to ensure its timely completion.  ANESTHESIA:  Spinal and IV sedation.  COMPLICATIONS:  None.  COMPONENTS:  DePuy AML large stature, 13.5 mm of femoral component, 36 mm outer diameter hip ball with a +5 mm neck length.  I used a 54 mm outer diameter Gription 3 metallic acetabular component with an apex hole eliminator, and a Marathon polyethylene liner +4 with a 10-degree posterior lip.  Components were press-fit.  DESCRIPTION OF PROCEDURE:  Mr. Aprea was met in the holding area.  I identified the left hip as the appropriate operative site and marked it accordingly.  Any questions were answered.  He was then transported to room #7.  A spinal anesthetic was performed per Anesthesia without difficulty.  A Foley catheter was inserted with a clear urine.  The patient was then placed under IV sedation and positioned in the lateral decubitus position with the left side up.  He was secured to the operating room table with the Innomed hip system.  The left hip was then prepped with chlorhexidine scrub and then DuraPrep x2 from the iliac crest to below to the midcalf.  Sterile draping was performed.  The patient received 2 g of Ancef IV.  A time-out was called.  A routine Southern incision was utilized and via sharp dissection, carried down to subcutaneous tissue.   Small bleeders with Bovie coagulated.  Adipose tissue was carefully incised with the Bovie and self-retaining retractors were placed more deeply.  The iliotibial band was identified and incised along the length of the skin incision.  By finger dissection, muscle fibers were separated and self-retaining retractor was placed more deeply.  Adipose tissue was gently retracted from the short external rotators.  Tendinous structures were tagged with 0 Ethibond suture and then incised.  The capsule was identified, incised along the femoral neck and head.  Throughout the procedure, I palpated the sciatic nerve and it was well out of harm's way.  The joint was then entered.  There was a clear yellow joint effusion and abundant beefy red synovitis.  Head was dislocated posteriorly and then amputated with the oscillating saw, a fingerbreadth proximal to the lesser trochanter.  The capsule was removed from the femoral neck.  A starter hole was then made under power to the piriformis fossa.  Canal finder was inserted and reaming was performed with power to 13.5 mm to accept a 13.5 mm component.  I had rasped to a 13.5 large stature femoral component, with a very nice position and nice canal fill.  Calcar reamer was used to obtain the appropriate calcar angle.  Retractors were  then placed about the acetabulum.  I performed a diffuse synovectomy.  There were a large number of osteophytes that were loose from around the periphery of the acetabulum and these were removed. Reaming was performed sequentially to 53 mm to accept a 54 component.  I medialized the acetabulum as it was quite shallow and had excellent bleeding bone circumferentially.  I then trailed a 52 mm component had nice rim fit, that would completely seat the 54, would not.  Accordingly the 54 mm outer diameter Gription 3 acetabular component was then impacted with a very nice fit was nice and stable.  It did not use any supplemental  screws.  The trial polyethylene neck component was then inserted, followed by the 13.5 mm large stature of femoral rasp.  We trailed several neck lengths with a 36 mm outer diameter hip ball, felt like the +5 was re- established leg lengths as we had deep in the acetabulum and was perfectly stable in both flexion and extension, internal and external rotation.  The trial components were then removed.  The acetabulum was irrigated. The apex hole eliminator inserted followed by the final marathon polyethylene component, a 10-degree posterior lip and +4.  I again irrigated the wound.  I did use the external guide to guide the appropriate angle of the acetabulum.  We then cleaned around the femoral neck.  The large stature 13.5 mm femoral component was then impacted on the calcar.  Wound was again irrigated.  We cleaned the [facility] taper neck and inserted the 36-mm outer diameter metallic hip ball with a +5 neck length.  We then carefully reduced it through a full range of motion, it remained perfectly stable.  Joint was again irrigated.  We did place topical tranexamic acid into the wound.  The capsule was closed anatomically with #1 Ethibond short external rotators closed with the similar material.  We injected 0.25% Marcaine without epinephrine around the wound.  The IT band was closed with running #1 Vicryl, subcu with several layers of Vicryl and 3-0 Monocryl.  Skin closed with skin clips.  Sterile bulky dressing was applied.  The patient was then placed supine, transported carefully to the operating stretcher and then to the postanesthesia recovery room in satisfactory condition.     Vonna Kotyk. Durward Fortes, M.D.     PWW/MEDQ  D:  02/21/2015  T:  02/22/2015  Job:  [id number]

## 2015-02-22 NOTE — Evaluation (Signed)
Occupational Therapy Evaluation Patient Details Name: Garrett Brown. MRN: [id number] DOB: 1950/11/01 Today's Date: 02/22/2015    History of Present Illness Patient is a 64 y/o male s/p left THA, posterior approach. No significant PMH.   Clinical Impression   Pt reports he was independent with ADLs PTA. Currently pt is min guard overall for ADLs and functional mobility with the exception of mod A for LB ADLs. Educated pt on compensatory strategies for LB ADLs, use of AE for increased independence with LB ADLs, tub transfer with tub bench, use of 3 in 1, safety with RW and technique; pt verbalized understanding. Pt to d/c home with 24/7 supervision for a week provided from his brother. Recommending HHOT for further rehab to maximize independence with ADLs and functional mobility. Pt would benefit from continued OT in order to increase independence and safety with LB ADLs and tub transfers.     Follow Up Recommendations  Home health OT;Supervision/Assistance - 24 hour    Equipment Recommendations  Tub/shower bench;Other (comment) (AE. Pt reports friend may have tub bench and AE )    Recommendations for Other Services       Precautions / Restrictions Precautions Precautions: Posterior Hip Precaution Booklet Issued: No Precaution Comments: Pt able to recall 3/3 precautions, reviewed during functional activity Required Braces or Orthoses: Knee Immobilizer - Left Restrictions Weight Bearing Restrictions: Yes Other Position/Activity Restrictions: WBAT LLE      Mobility Bed Mobility Overal bed mobility: Modified Independent Bed Mobility: Supine to Sit;Sit to Supine     Supine to sit: Modified independent (Device/Increase time);HOB elevated Sit to supine: Modified independent (Device/Increase time);HOB elevated   General bed mobility comments: use of rail for support. Maintaining posterior hip precautions  Transfers Overall transfer level: Needs assistance Equipment used:  Rolling walker (2 wheeled) Transfers: Sit to/from Stand Sit to Stand: Min guard         General transfer comment: Verbal cues for hand placement. Verbal cues for keeping walker on ground during mobility. Verbal cues and demonstration for small turns to maintain hip precautions    Balance Overall balance assessment: Needs assistance Sitting-balance support: Feet supported Sitting balance-Leahy Scale: Good     Standing balance support: Bilateral upper extremity supported Standing balance-Leahy Scale: Poor Standing balance comment: RW for support                            ADL Overall ADL's : Needs assistance/impaired Eating/Feeding: Set up;Sitting   Grooming: Min guard;Standing               Lower Body Dressing: Moderate assistance;Sit to/from stand Lower Body Dressing Details (indicate cue type and reason): Mod A for L sock and threading L leg into pants Toilet Transfer: Min guard;Ambulation;BSC;RW (BSC over toilet)       Tub/ Shower Transfer: Minimal assistance;Ambulation;Tub bench;Rolling walker;Tub transfer   Functional mobility during ADLs: Min guard;Rolling walker General ADL Comments: No family present for OT eval. Educated pt on compensatory strategies for LB ADLs, use of AE for independence with LB ADLs, use of 3 in 1, tub transfer with tub bench, safety with RW and transfers; pt verbalized understanding.      Vision     Perception     Praxis      Pertinent Vitals/Pain Pain Assessment: 0-10 Pain Score: 7  Pain Location: L hip with activity Pain Descriptors / Indicators: Aching;Sore Pain Intervention(s): Limited activity within patient's tolerance;Monitored during session;Repositioned;Ice applied  Hand Dominance Right   Extremity/Trunk Assessment Upper Extremity Assessment Upper Extremity Assessment: Overall WFL for tasks assessed   Lower Extremity Assessment Lower Extremity Assessment: Defer to PT evaluation        Communication Communication Communication: No difficulties   Cognition Arousal/Alertness: Awake/alert Behavior During Therapy: WFL for tasks assessed/performed Overall Cognitive Status: Within Functional Limits for tasks assessed                     General Comments       Exercises       Shoulder Instructions      Home Living Family/patient expects to be discharged to:: Private residence Living Arrangements: Alone Available Help at Discharge: Family;Available PRN/intermittently (Brother staying until Sunday, friends able to check in) Type of Home: House Home Access: Stairs to enter Secretary/administratorntrance Stairs-Number of Steps: 2 Entrance Stairs-Rails: None Home Layout: Two level (Ordered a hospital bed so he can sleep on first floor) Alternate Level Stairs-Number of Steps: 1 flight   Bathroom Shower/Tub: Chief Strategy OfficerTub/shower unit   Bathroom Toilet: Standard Bathroom Accessibility: Yes How Accessible: Accessible via walker Home Equipment: Cane - single point;Bedside commode;Walker - 2 wheels (Friend may have tub bench and AE )          Prior Functioning/Environment Level of Independence: Independent with assistive device(s)        Comments: 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 OT Goal Formulation: With patient Time For Goal Achievement: 03/08/15 Potential to Achieve Goals: Good ADL Goals Pt Will Perform Lower Body Bathing: with modified independence;with adaptive equipment;sit to/from stand Pt Will Perform Lower  Body Dressing: with modified independence;with adaptive equipment;sit to/from stand Pt Will Perform Tub/Shower Transfer: Tub transfer;with modified independence;ambulating;tub bench;rolling walker  OT Frequency: Min 2X/week   Barriers to D/C:            Co-evaluation              End of Session Equipment Utilized During Treatment: Gait belt;Rolling walker;Left knee immobilizer Nurse Communication: Mobility status  Activity Tolerance: Patient tolerated treatment well Patient left: in bed;with call bell/phone within reach   Time: 1502-1519 OT Time Calculation (min): 17 min Charges:  OT General Charges $OT Visit: 1 Procedure OT Evaluation $Initial OT Evaluation Tier I: 1 Procedure G-Codes:    Gaye AlkenBailey A Herve Haug M.S., OTR/L Pager: [phone]  02/22/2015, 3:31 PM

## 2015-02-22 NOTE — Progress Notes (Signed)
Physical Therapy Treatment Patient Details Name: Garrett Brown. MRN: [id number] DOB: [birth year] Today's Date: 02/22/2015    History of Present Illness Patient is a 64 y/o male s/p left THA, posterior approach. No significant PMH.    PT Comments    Pt progressing well with mobility, he walked 160' with RW, no LOB. Performed THA exercises with min A. Pt recalls 2 of 3 posterior hip precautions, reviewed precautions in detail.   Follow Up Recommendations  Home health PT;Supervision/Assistance - 24 hour     Equipment Recommendations  None recommended by PT    Recommendations for Other Services OT consult     Precautions / Restrictions Precautions Precautions: Posterior Hip Precaution Booklet Issued: No Precaution Comments: Reviewed precautions. Pt recalled 2 of 3. Required Braces or Orthoses: Knee Immobilizer - Left Restrictions Weight Bearing Restrictions: Yes Other Position/Activity Restrictions: WBAT LLE    Mobility  Bed Mobility Overal bed mobility: Modified Independent Bed Mobility: Supine to Sit     Supine to sit: HOB elevated;Modified independent (Device/Increase time)     General bed mobility comments: use of rail for support  Transfers Overall transfer level: Needs assistance Equipment used: Rolling walker (2 wheeled) Transfers: Sit to/from Stand Sit to Stand: Supervision         General transfer comment: verbal cues for hand placement.  Ambulation/Gait Ambulation/Gait assistance: Supervision Ambulation Distance (Feet): 160 Feet Assistive device: Rolling walker (2 wheeled) Gait Pattern/deviations: Step-to pattern Gait velocity: decreased   General Gait Details: Steady with RW, improved heel strike and L knee flexion during swing phase. Increased velocity compared to this morning.   Stairs            Wheelchair Mobility    Modified Rankin (Stroke Patients Only)       Balance     Sitting balance-Leahy Scale: Good        Standing balance-Leahy Scale: Poor                      Cognition Arousal/Alertness: Awake/alert Behavior During Therapy: WFL for tasks assessed/performed Overall Cognitive Status: Within Functional Limits for tasks assessed                      Exercises Total Joint Exercises Ankle Circles/Pumps: Both;10 reps;Supine Short Arc Quad: AROM;Left;10 reps;Supine Heel Slides: Left;AAROM;Supine;20 reps Hip ABduction/ADduction: AAROM;Left;10 reps;Supine Long Arc Quad: AROM;Left;10 reps;Seated    General Comments        Pertinent Vitals/Pain Pain Score: 6  Pain Location: L hip with activity Pain Descriptors / Indicators: Sore Pain Intervention(s): Monitored during session;Limited activity within patient's tolerance;Patient requesting pain meds-RN notified (declined ice)    Home Living                      Prior Function            PT Goals (current goals can now be found in the care plan section) Acute Rehab 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 PT Goal Formulation: With patient Time For Goal Achievement: 03/07/15 Potential to Achieve Goals: Fair Progress towards PT goals: Progressing toward goals    Frequency  7X/week    PT Plan Current plan remains appropriate    Co-evaluation             End of Session Equipment Utilized During Treatment: Gait belt Activity Tolerance: Patient tolerated treatment well Patient left: with call bell/phone within reach;in bed;with nursing/sitter in room  Time: 3244-01021404-1422 PT Time Calculation (min) (ACUTE ONLY): 18 min  Charges:  $Gait Training: 8-22 mins $Therapeutic Exercise: 8-22 mins                    G Codes:      Tamala SerUhlenberg, Kamia Insalaco Kistler 02/22/2015, 2:28 PM [phone]

## 2015-02-22 NOTE — Discharge Instructions (Signed)
Information on my medicine - XARELTO® (Rivaroxaban) ° °This medication education was reviewed with me or my healthcare representative as part of my discharge preparation.  The pharmacist that spoke with me during my hospital stay was:  Jaloni Sorber P, RPH ° °Why was Xarelto® prescribed for you? °Xarelto® was prescribed for you to reduce the risk of blood clots forming after orthopedic surgery. The medical term for these abnormal blood clots is venous thromboembolism (VTE). ° °What do you need to know about xarelto® ? °Take your Xarelto® ONCE DAILY at the same time every day. °You may take it either with or without food. ° °If you have difficulty swallowing the tablet whole, you may crush it and mix in applesauce just prior to taking your dose. ° °Take Xarelto® exactly as prescribed by your doctor and DO NOT stop taking Xarelto® without talking to the doctor who prescribed the medication.  Stopping without other VTE prevention medication to take the place of Xarelto® may increase your risk of developing a clot. ° °After discharge, you should have regular check-up appointments with your healthcare provider that is prescribing your Xarelto®.   ° °What do you do if you miss a dose? °If you miss a dose, take it as soon as you remember on the same day then continue your regularly scheduled once daily regimen the next day. Do not take two doses of Xarelto® on the same day.  ° °Important Safety Information °A possible side effect of Xarelto® is bleeding. You should call your healthcare provider right away if you experience any of the following: °? Bleeding from an injury or your nose that does not stop. °? Unusual colored urine (red or dark brown) or unusual colored stools (red or black). °? Unusual bruising for unknown reasons. °? A serious fall or if you hit your head (even if there is no bleeding). ° °Some medicines may interact with Xarelto® and might increase your risk of bleeding while on Xarelto®. To help avoid this,  consult your healthcare provider or pharmacist prior to using any new prescription or non-prescription medications, including herbals, vitamins, non-steroidal anti-inflammatory drugs (NSAIDs) and supplements. ° °This website has more information on Xarelto®: www.xarelto.com. ° ° ° °

## 2015-02-23 LAB — BASIC METABOLIC PANEL
Anion gap: 8 (ref 5–15)
BUN: 7 mg/dL (ref 6–20)
CO2: 28 mmol/L (ref 22–32)
Calcium: 8.1 mg/dL — ABNORMAL LOW (ref 8.9–10.3)
Chloride: 97 mmol/L — ABNORMAL LOW (ref 101–111)
Creatinine, Ser: 0.93 mg/dL (ref 0.61–1.24)
GFR calc Af Amer: >60 mL/min (ref >60)
GLUCOSE: 124 mg/dL — AB (ref 65–99)
POTASSIUM: 3.5 mmol/L (ref 3.5–5.1)
Sodium: 133 mmol/L — ABNORMAL LOW (ref 135–145)

## 2015-02-23 LAB — CBC
HCT: 36.7 % — ABNORMAL LOW (ref 39.0–52.0)
Hemoglobin: 12 g/dL — ABNORMAL LOW (ref 13.0–17.0)
MCH: 29.5 pg (ref 26.0–34.0)
MCHC: 32.7 g/dL (ref 30.0–36.0)
MCV: 90.2 fL (ref 78.0–100.0)
PLATELETS: 222 10*3/uL (ref 150–400)
RBC: 4.07 MIL/uL — AB (ref 4.22–5.81)
RDW: 14.2 % (ref 11.5–15.5)
WBC: 13.9 10*3/uL — ABNORMAL HIGH (ref 4.0–10.5)

## 2015-02-23 MED ORDER — OXYCODONE HCL 5 MG PO TABS: 5.0000 mg | ORAL_TABLET | ORAL | Status: DC | PRN

## 2015-02-23 MED ORDER — RIVAROXABAN 10 MG PO TABS: 10.0000 mg | ORAL_TABLET | Freq: Every day | ORAL | Status: DC

## 2015-02-23 MED ORDER — METHOCARBAMOL 500 MG PO TABS: 500.0000 mg | ORAL_TABLET | Freq: Three times a day (TID) | ORAL | Status: DC | PRN

## 2015-02-23 NOTE — Care Management Note (Signed)
Case Management Note  Patient Details  Name: Garrett GeneralJoseph Razon Jr. MRN: [id number] Date of Birth: 06/08/1950  Subjective/Objective:  64 yr old male s/p left total hip arthroplasty.                  Action/Plan:  Case manager spoke with patient concerning home health and DME needs at discharge. [facility]. Garrett Brown states he has a rolling walker and 3in1. His brother will assist him at discharge.    Expected Discharge Date:   02/23/15                Expected Discharge Plan:   Home with Home Health  In-House Referral:  NA  Discharge planning Services  CM Consult  Post Acute Care Choice:  Home Health Choice offered to:     DME Arranged:   NA DME Agency:     HH Arranged:  PT [facility]  Status of Service:  Completed, signed off  Medicare Important Message Given:    Date Medicare IM Given:    Medicare IM give by:    Date Additional Medicare IM Given:    Additional Medicare Important Message give by:     If discussed at Long Length of Stay Meetings, dates discussed:    Additional Comments:  Durenda GuthrieBrady, Gera Inboden Naomi, RN 02/23/2015, 10:49 AM

## 2015-02-23 NOTE — Discharge Summary (Signed)
Norlene Campbell, MD   Jacqualine Code, PA-C [address], [place], [place]  [address]                             [phone]  PATIENT ID: Garrett Brown.        MRN:  [id number]          DOB/AGE: November 03, 1950 / 64 y.o.    DISCHARGE SUMMARY  ADMISSION DATE:    02/21/2015 DISCHARGE DATE:   02/23/2015   ADMISSION DIAGNOSIS: LEFT HIP OSTEOARTHRITIS    DISCHARGE DIAGNOSIS:  LEFT HIP OSTEOARTHRITIS    ADDITIONAL DIAGNOSIS: Principal Problem:   Primary osteoarthritis of left hip Active Problems:   S/P total hip arthroplasty  Past Medical History  Diagnosis Date  . Arthritis   . Constipation due to pain medication     PROCEDURE: Procedure(s): TOTAL LEFT HIP ARTHROPLASTY Left on 02/21/2015  CONSULTS: none     HISTORY:Garrett Brown is a very pleasant 64 year old male who was seen today for evaluation of his left hip. He first had onset of pain back between late May and early June of left hip pain without any history of injury or trauma. It had become progressive and he had difficulty with ambulation as well as sleeping. He was unable to find a comfortable position. The pain was localized mainly in the superior left groin area and along the lateral aspect of his hip. At that time he denied any back pain or any thigh, knee, or even leg pain. He had tried oxycodone that he had from a while ago and that did not really have much help at that time, as well as a muscle relaxer. He has tried Aleve as well initially. He was seen on November 28, 2014 and at that time films were obtained revealing about 25% uncoverage of his hip with some degenerative changes of the hip joint. There was some osteophytes noted both superiorly and inferiorly with possible avascular necrosis. He did have an MRI scan performed on January 13, 2015 which revealed advanced degenerative changes involving both hips, left greater than right. 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 There was also marked edema like signal abnormality in the surrounding hip and pelvic musculature, maybe associated with myositis or possible muscle injuries. He was scheduled for a corticosteroid injection to the left hip by Dr. Alvester Morin and that was accomplished on December 12, 2014. He states that at that time he was better for a short period of time, but by January 17, 2015 the pain had returned and perhaps it was worse. He has now gotten to the point where he has to use a cane for ambulation. 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 He is now having marked difficulty with sleeping, ambulating, and just doing activities of daily living. He is quite limited because of this pain and discomfort.  HOSPITAL COURSE:  Garrett Brown. is a 64 y.o. admitted on 02/21/2015 and found to have a diagnosis of LEFT HIP OSTEOARTHRITIS.  After appropriate laboratory studies were obtained  they were taken to the operating room on 02/21/2015 and underwent  Procedure(s): TOTAL LEFT HIP ARTHROPLASTY  .   They were given perioperative antibiotics:  Anti-infectives    Start     Dose/Rate Route Frequency Ordered Stop   02/21/15 1445  ceFAZolin (ANCEF)  IVPB 2 g/50 mL premix     2 g 100 mL/hr over 30 Minutes Intravenous Every 6 hours 02/21/15 1444 02/22/15 0244   02/21/15 0749  ceFAZolin (ANCEF) 2-3 GM-% IVPB SOLR    Comments:  Scronce, Trina   : cabinet override      02/21/15 0749 02/21/15 1959   02/21/15 0747  ceFAZolin (ANCEF) IVPB 2 g/50 mL premix     2 g 100 mL/hr over 30 Minutes Intravenous On call to O.R. 02/21/15 0747 02/21/15 1045    .  Tolerated the procedure well.  Placed with a foley intraoperatively.    Toradol was given post op.  POD #1, allowed out of bed to a chair.  PT for ambulation and exercise program.  Foley D/C'd in morning.  IV saline locked.  O2 discontionued.  POD #2, continued PT and ambulation.    . The remainder of the hospital course was dedicated to ambulation and strengthening.   The patient was discharged on 2 Days Post-Op in  Stable condition.  Blood products given:none  DIAGNOSTIC STUDIES: Recent vital signs: Patient Vitals for the past 24 hrs:  BP Temp Pulse Resp SpO2  02/23/15 0528 122/70 mmHg 100 F (37.8 C) 89 18 96 %  02/22/15 2100 123/69 mmHg 99.8 F (37.7 C) 94 18 95 %  02/22/15 1300 (!) 138/99 mmHg 98.8 F (37.1 C) 93 18 98 %       Recent laboratory studies:  Recent Labs  02/22/15 0720 02/23/15 0541  WBC 11.6* 13.9*  HGB 12.2* 12.0*  HCT 36.1* 36.7*  PLT 222 222    Recent Labs  02/22/15 0720 02/23/15 0541  NA 137 133*  K 3.8 3.5  CL 99* 97*  CO2 28 28  BUN 8 7  CREATININE 0.81 0.93  GLUCOSE 126* 124*  CALCIUM 8.5* 8.1*   Lab Results  Component Value Date   INR 1.12 02/10/2015     Recent Radiographic Studies :  Dg Chest 2 View  02/10/2015  CLINICAL DATA:  Total left hip arthroplasty.  Preop.  Smoker. EXAM: CHEST  2 VIEW COMPARISON:  None. FINDINGS: Heart and mediastinal contours are within normal limits. No focal opacities or effusions. No acute bony abnormality. Degenerative spurring in the thoracic spine. IMPRESSION: No active cardiopulmonary disease. Electronically Signed   By: Charlett Nose M.D.   On: 02/10/2015 13:53   Dg Pelvis Portable  02/21/2015  CLINICAL DATA:  Status post left hip replacement. EXAM: PORTABLE PELVIS 1-2 VIEWS COMPARISON:  None. FINDINGS: Total left hip replacement is identified without malalignment. Postsurgical changes including soft tissue air, swelling and skin staples are noted. Degenerative joint changes of the right hip are noted. IMPRESSION: Total left hip replacement without malalignment. Electronically Signed   By: Sherian Rein M.D.   On: 02/21/2015 13:35   Dg Hip Port Unilat With Pelvis 1v Left  02/21/2015  CLINICAL DATA:  Postoperative left hip total arthroplasty EXAM: DG HIP (WITH OR WITHOUT PELVIS)  1V PORT LEFT COMPARISON:  None. FINDINGS: Single cross-table lateral view of the left hip demonstrate left hip replacement without malalignment. IMPRESSION: Single cross-table lateral view of the left hip demonstrate left hip replacement without malalignment. Electronically Signed   By: Sherian Rein M.D.   On: 02/21/2015 13:35    DISCHARGE INSTRUCTIONS: Discharge Instructions    Call MD / Call 911    Complete by:  As directed   If you experience chest pain or shortness of breath, CALL 911 and be transported to  the hospital emergency room.  If you develope a fever above 101 F, pus (white drainage) or increased drainage or redness at the wound, or calf pain, call your surgeon's office.     Change dressing    Complete by:  As directed   DO NOT CHANGE THE DRESSING     Constipation Prevention    Complete by:  As directed   Drink plenty of fluids.  Prune juice may be helpful.  You may use a stool softener, such as Colace (over the counter) 100 mg twice a day.  Use MiraLax (over the counter) for constipation as needed.     Diet Brown    Complete by:  As directed      Discharge instructions    Complete by:  As directed   INSTRUCTIONS AFTER JOINT REPLACEMENT   Remove items at home which could result in a fall. This includes throw rugs or furniture in walking pathways ICE to the affected joint every three hours while awake for 30 minutes at a time, for at least the first 3-5 days, and then as needed for pain and swelling.  Continue to use ice for pain and swelling. You may notice swelling that will progress down to the foot and ankle.  This is normal after surgery.  Elevate your leg when you are not up walking on it.   Continue to use the breathing machine you got in the hospital (incentive spirometer) which will help keep your temperature down.  It is common for your temperature to cycle up and down following surgery, especially at night when you are not up moving around and exerting yourself.  The  breathing machine keeps your lungs expanded and your temperature down.   DIET:  As you were doing prior to hospitalization, we recommend a well-balanced diet.  DRESSING / WOUND CARE / SHOWERING  Keep the surgical dressing until follow up.  The dressing is water proof, so you can shower without any extra covering.  IF THE DRESSING FALLS OFF or the wound gets wet inside, change the dressing with sterile gauze.  Please use good hand washing techniques before changing the dressing.  Do not use any lotions or creams on the incision until instructed by your surgeon.    ACTIVITY  Increase activity slowly as tolerated, but follow the weight bearing instructions below.   No driving for 6 weeks or until further direction given by your physician.  You cannot drive while taking narcotics.  No lifting or carrying greater than 10 lbs. until further directed by your surgeon. Avoid periods of inactivity such as sitting longer than an hour when not asleep. This helps prevent blood clots.  You may return to work once you are authorized by your doctor.     WEIGHT BEARING   Weight bearing as tolerated with assist device (walker, cane, etc) as directed, use it as long as suggested by your surgeon or therapist, typically at least 4-6 weeks.   EXERCISES  Results after joint replacement surgery are often greatly improved when you follow the exercise, range of motion and muscle strengthening exercises prescribed by your doctor. Safety measures are also important to protect the joint from further injury. Any time any of these exercises cause you to have increased pain or swelling, decrease what you are doing until you are comfortable again and then slowly increase them. If you have problems or questions, call your caregiver or physical therapist for advice.   Rehabilitation is important following a joint replacement.  After just a few days of immobilization, the muscles of the leg can become weakened and shrink  (atrophy).  These exercises are designed to build up the tone and strength of the thigh and leg muscles and to improve motion. Often times heat used for twenty to thirty minutes before working out will loosen up your tissues and help with improving the range of motion but do not use heat for the first two weeks following surgery (sometimes heat can increase post-operative swelling).   These exercises can be done on a training (exercise) mat, on the floor, on a table or on a bed. Use whatever works the best and is most comfortable for you.    Use music or television while you are exercising so that the exercises are a pleasant break in your day. This will make your life better with the exercises acting as a break in your routine that you can look forward to.   Perform all exercises about fifteen times, three times per day or as directed.  You should exercise both the operative leg and the other leg as well.   Exercises include:   Quad Sets - Tighten up the muscle on the front of the thigh (Quad) and hold for 5-10 seconds.   Straight Leg Raises - With your knee straight (if you were given a brace, keep it on), lift the leg to 60 degrees, hold for 3 seconds, and slowly lower the leg.  Perform this exercise against resistance later as your leg gets stronger.  Leg Slides: Lying on your back, slowly slide your foot toward your buttocks, bending your knee up off the floor (only go as far as is comfortable). Then slowly slide your foot back down until your leg is flat on the floor again.  Angel Wings: Lying on your back spread your legs to the side as far apart as you can without causing discomfort.  Hamstring Strength:  Lying on your back, push your heel against the floor with your leg straight by tightening up the muscles of your buttocks.  Repeat, but this time bend your knee to a comfortable angle, and push your heel against the floor.  You may put a pillow under the heel to make it more comfortable if  necessary.   A rehabilitation program following joint replacement surgery can speed recovery and prevent re-injury in the future due to weakened muscles. Contact your doctor or a physical therapist for more information on knee rehabilitation.    CONSTIPATION  Constipation is defined medically as fewer than three stools per week and severe constipation as less than one stool per week.  Even if you have a regular bowel pattern at home, your normal regimen is likely to be disrupted due to multiple reasons following surgery.  Combination of anesthesia, postoperative narcotics, change in appetite and fluid intake all can affect your bowels.   YOU MUST use at least one of the following options; they are listed in order of increasing strength to get the job done.  They are all available over the counter, and you may need to use some, POSSIBLY even all of these options:    Drink plenty of fluids (prune juice may be helpful) and high fiber foods Colace 100 mg by mouth twice a day  Senokot for constipation as directed and as needed Dulcolax (bisacodyl), take with full glass of water  Miralax (polyethylene glycol) once or twice a day as needed.  If you have tried all these things and are  unable to have a bowel movement in the first 3-4 days after surgery call either your surgeon or your primary doctor.    If you experience loose stools or diarrhea, hold the medications until you stool forms back up.  If your symptoms do not get better within 1 week or if they get worse, check with your doctor.  If you experience "the worst abdominal pain ever" or develop nausea or vomiting, please contact the office immediately for further recommendations for treatment.   ITCHING:  If you experience itching with your medications, try taking only a single pain pill, or even half a pain pill at a time.  You can also use Benadryl over the counter for itching or also to help with sleep.   TED HOSE STOCKINGS:  Use stockings  on both legs until for at least 2 weeks or as directed by physician office. They may be removed at night for sleeping.  MEDICATIONS:  See your medication summary on the "After Visit Summary" that nursing will review with you.  You may have some home medications which will be placed on hold until you complete the course of blood thinner medication.  It is important for you to complete the blood thinner medication as prescribed.  PRECAUTIONS:  If you experience chest pain or shortness of breath - call 911 immediately for transfer to the hospital emergency department.   If you develop a fever greater that 101 F, purulent drainage from wound, increased redness or drainage from wound, foul odor from the wound/dressing, or calf pain - CONTACT YOUR SURGEON.                                                   FOLLOW-UP APPOINTMENTS:  If you do not already have a post-op appointment, please call the office for an appointment to be seen by your surgeon.  Guidelines for how soon to be seen are listed in your "After Visit Summary", but are typically between 1-4 weeks after surgery.  OTHER INSTRUCTIONS:   Knee Replacement:  Do not place pillow under knee, focus on keeping the knee straight while resting. CPM instructions: 0-90 degrees, 2 hours in the morning, 2 hours in the afternoon, and 2 hours in the evening. Place foam block, curve side up under heel at all times except when in CPM or when walking.  DO NOT modify, tear, cut, or change the foam block in any way.  MAKE SURE YOU:  Understand these instructions.  Get help right away if you are not doing well or get worse.    Thank you for letting us be a part of your medical care team.  It is a privilege we respect greatly.  We hope these instructions will help you stay on track for a fast and full recovery!     Driving restrictions    Complete by:  As directed   No driving for 6 weeks     Follow the hip precautions as taught in Physical Therapy    Complete  by:  As directed      Increase activity slowly as tolerated    Complete by:  As directed      Lifting restrictions    Complete by:  As directed   No lifting for 6 weeks     Patient may shower    Complete by:  As directed   You may shower over the brown dressing     TED hose    Complete by:  As directed   Use stockings (TED hose) for 3 weeks on left leg.  You may remove them at night for sleeping.     Weight bearing as tolerated    Complete by:  As directed   Laterality:  left  Extremity:  Lower           DISCHARGE MEDICATIONS:     Medication List    STOP taking these medications        ALEVE 220 MG tablet  Generic drug:  naproxen sodium     HYDROcodone-acetaminophen 5-325 MG tablet  Commonly known as:  NORCO/VICODIN      TAKE these medications        methocarbamol 500 MG tablet  Commonly known as:  ROBAXIN  Take 1 tablet (500 mg total) by mouth every 8 (eight) hours as needed for muscle spasms.     oxyCODONE 5 MG immediate release tablet  Commonly known as:  Oxy IR/ROXICODONE  Take 1-2 tablets (5-10 mg total) by mouth every 4 (four) hours as needed for moderate pain or severe pain.     rivaroxaban 10 MG Tabs tablet  Commonly known as:  XARELTO  Take 1 tablet (10 mg total) by mouth daily with breakfast.        FOLLOW UP VISIT:       Follow-up Information    [facility]   Why:  Someone from [facility] will contact you concerning start date and time for therapy.   Contact information:   [address] [place] [address] [phone]       Follow up with Valeria BatmanWHITFIELD, PETER W, MD. Schedule an appointment as soon as possible for a visit on 03/06/2015.   Specialty:  Orthopedic Surgery   Contact information:   [address] [place] [address] [phone]       DISPOSITION:   Home  CONDITION:  Stable   Oris DroneBrian D. Aleda Granaetrarca, PA-C [facility] [phone]  02/23/2015 12:54 PM

## 2015-02-23 NOTE — Progress Notes (Signed)
Physical Therapy Treatment Patient Details Name: Garrett GeneralJoseph Erber Jr. MRN: [id number] DOB: 10/15/1950 Today's Date: 02/23/2015    History of Present Illness Patient is a 64 y/o male s/p left THA, posterior approach. No significant PMH.    PT Comments    Pt is progressing quite well with mobility, he walked 220' with RW and completed stair training. He is ready to DC home from PT standpoint. He plans to stay on 1st floor of his home where he has a hospital bed.   Follow Up Recommendations  Home health PT;Supervision/Assistance - 24 hour     Equipment Recommendations  None recommended by PT    Recommendations for Other Services OT consult     Precautions / Restrictions Precautions Precautions: Posterior Hip Precaution Booklet Issued: No Precaution Comments: Reviewed precautions. Pt recalled 2 of 3. Required Braces or Orthoses: Knee Immobilizer - Left Restrictions Weight Bearing Restrictions: Yes Other Position/Activity Restrictions: WBAT LLE    Mobility  Bed Mobility Overal bed mobility: Modified Independent Bed Mobility: Supine to Sit     Supine to sit: Modified independent (Device/Increase time);HOB elevated     General bed mobility comments: use of rail for support. Maintaining posterior hip precautions  Transfers Overall transfer level: Needs assistance Equipment used: Rolling walker (2 wheeled)   Sit to Stand: Supervision         General transfer comment: Verbal cues for hand placement.   Ambulation/Gait Ambulation/Gait assistance: Supervision Ambulation Distance (Feet): 220 Feet Assistive device: Rolling walker (2 wheeled)     Gait velocity interpretation: at or above normal speed for age/gender General Gait Details: steady with RW, verbal cues to roll rather than lift RW and for avoiding twisting on LLE while turning   Stairs Stairs: Yes Stairs assistance: Min assist Stair Management: No rails;Backwards;Step to pattern Number of Stairs:  2 General stair comments: min A to manage RW, verbal cues for sequencing  Wheelchair Mobility    Modified Rankin (Stroke Patients Only)       Balance     Sitting balance-Leahy Scale: Good       Standing balance-Leahy Scale: Fair                      Cognition Arousal/Alertness: Awake/alert Behavior During Therapy: WFL for tasks assessed/performed Overall Cognitive Status: Within Functional Limits for tasks assessed                      Exercises Total Joint Exercises Ankle Circles/Pumps: Both;10 reps;Supine Short Arc Quad: AROM;Left;10 reps;Supine Heel Slides: Left;AAROM;Supine;20 reps Hip ABduction/ADduction: AAROM;Left;10 reps;Supine    General Comments        Pertinent Vitals/Pain Pain Score: 6  Pain Location: L hip with activity Pain Intervention(s): Monitored during session;Premedicated before session;Limited activity within patient's tolerance (pt declined ice)    Home Living                      Prior Function            PT Goals (current goals can now be found in the care plan section) Acute Rehab 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 PT Goal Formulation: With patient Time For Goal Achievement: 03/07/15 Potential to Achieve Goals: Fair Progress towards PT goals: Progressing toward goals    Frequency  7X/week    PT Plan Current plan remains appropriate    Co-evaluation             End  of Session Equipment Utilized During Treatment: Gait belt Activity Tolerance: Patient tolerated treatment well Patient left: with call bell/phone within reach;in chair     Time: 1610-9604 PT Time Calculation (min) (ACUTE ONLY): 22 min  Charges:  $Gait Training: 8-22 mins                    G Codes:      Tamala Ser 02/23/2015, 9:36 AM [phone]

## 2015-02-23 NOTE — Plan of Care (Signed)
Problem: Consults Goal: Diagnosis- Total Joint Replacement Primary Total Hip     

## 2015-02-23 NOTE — Progress Notes (Signed)
Occupational Therapy Treatment Patient Details Name: Garrett GeneralJoseph Wortmann Jr. MRN: [id number] DOB: 06/07/1950 Today's Date: 02/23/2015    History of present illness Patient is a 64 y/o male s/p left THA, posterior approach. No significant PMH.   OT comments  Pt making good progress toward OT goals. Pt able to recall 3/3 hip precautions. Educated pt on AE for LB ADLs, pt demonstrated understanding. Educated on tub bench transfer technique, pt declined to practice but verbalized understanding. Pt ready to d/c home from an OT standpoint. Continue to follow pt acutely.    Follow Up Recommendations  Home health OT;Supervision/Assistance - 24 hour    Equipment Recommendations  Tub/shower bench;Other (comment) (AE. Pt reports he was able to get items from a friend )    Recommendations for Other Services      Precautions / Restrictions Precautions Precautions: Posterior Hip Precaution Booklet Issued: No Precaution Comments: Pt able to recall 3/3. Reviewed precautions Required Braces or Orthoses: Knee Immobilizer - Left Restrictions Weight Bearing Restrictions: Yes LLE Weight Bearing: Weight bearing as tolerated Other Position/Activity Restrictions: WBAT LLE       Mobility Bed Mobility Overal bed mobility: Modified Independent Bed Mobility: Supine to Sit     Supine to sit: Modified independent (Device/Increase time);HOB elevated     General bed mobility comments: Pt in recliner  Transfers Overall transfer level: Needs assistance Equipment used: Rolling walker (2 wheeled)   Sit to Stand: Supervision         General transfer comment: Verbal cues for hand placement.     Balance     Sitting balance-Leahy Scale: Good       Standing balance-Leahy Scale: Fair                     ADL Overall ADL's : Needs assistance/impaired                     Lower Body Dressing: Supervision/safety;With adaptive equipment Lower Body Dressing Details (indicate cue type  and reason): Pt demonstrated use of reahcer for LB dressing and sock aide to don socks               General ADL Comments: No family present during session. Educated and demonstrated use of AE for LB ADLs and tub bench transfer technique. Pt demonstrated understanding of AE. Pt declined practicing tub bench transfer but verbalized understanding of technique. Pt reports that he has access to a tub bench and AE from a friend; friend is bringing equipment to his house today.       Vision                     Perception     Praxis      Cognition   Behavior During Therapy: [facility]FL for tasks assessed/performed Overall Cognitive Status: Within Functional Limits for tasks assessed                       Extremity/Trunk Assessment               Exercises Total Joint Exercises Ankle Circles/Pumps: Both;10 reps;Supine Short Arc Quad: AROM;Left;10 reps;Supine Heel Slides: Left;AAROM;Supine;20 reps Hip ABduction/ADduction: AAROM;Left;10 reps;Supine   Shoulder Instructions       General Comments      Pertinent Vitals/ Pain       Pain Assessment: Faces Pain Score: 6  Faces Pain Scale: Hurts little more Pain Location: L hip Pain Descriptors / Indicators: Aching;Sore  Pain Intervention(s): Limited activity within patient's tolerance;Monitored during session;Ice applied  Home Living                                          Prior Functioning/Environment              Frequency Min 2X/week     Progress Toward Goals  OT Goals(current goals can now be found in the care plan section)  Progress towards OT goals: Progressing toward goals  Acute Rehab OT Goals Patient Stated Goal: go home today  Plan Discharge plan remains appropriate    Co-evaluation                 End of Session Equipment Utilized During Treatment: Other (comment) (AE: sock aide, reacher)   Activity Tolerance Patient tolerated treatment well   Patient Left in  chair;with call bell/phone within reach   Nurse Communication          Time: 1610-9604 OT Time Calculation (min): 13 min  Charges: OT General Charges $OT Visit: 1 Procedure OT Treatments $Self Care/Home Management : 8-22 mins  Gaye Alken M.S., OTR/L Pager: [phone]  02/23/2015, 10:26 AM

## 2018-12-16 ENCOUNTER — Encounter: Payer: Self-pay | Admitting: Orthopaedic Surgery

## 2018-12-16 ENCOUNTER — Ambulatory Visit (INDEPENDENT_AMBULATORY_CARE_PROVIDER_SITE_OTHER): Payer: Medicare Other | Admitting: Orthopaedic Surgery

## 2018-12-16 ENCOUNTER — Ambulatory Visit (INDEPENDENT_AMBULATORY_CARE_PROVIDER_SITE_OTHER): Payer: Medicare Other

## 2018-12-16 ENCOUNTER — Other Ambulatory Visit: Payer: Self-pay

## 2018-12-16 VITALS — BP 122/76 | HR 77 | Ht 71.0 in | Wt 195.0 lb

## 2018-12-16 DIAGNOSIS — M25551 Pain in right hip: Secondary | ICD-10-CM

## 2018-12-16 NOTE — Addendum Note (Signed)
Addended by: Lendon Collar on: 12/16/2018 04:18 PM   Modules accepted: Orders

## 2018-12-16 NOTE — Progress Notes (Signed)
Office Visit Note   Patient: Garrett GeneralJoseph Steinkamp Jr.           Date of Birth: 03/09/1951           MRN: [id number] Visit Date: 12/16/2018              Requested by: No referring provider defined for this encounter. PCP: System, Pcp Not In   Assessment & Plan: Visit Diagnoses:  1. Pain in right hip     Plan: Primary osteoarthritis right hip.  Long discussion regarding treatment options.  She will would like to proceed with hip replacement but has obligations for the next several months.  In the interim we will schedule an intra-articular hip injection.  Doing well 4 years status post left total hip replacement.  Garrett Brown is aware that he has to wait 6 to 8 weeks after injection to consider hip replacement  Follow-Up Instructions: Return if symptoms worsen or fail to improve.   Orders:  Orders Placed This Encounter  Procedures  . XR HIP UNILAT W OR W/O PELVIS 2-3 VIEWS RIGHT   No orders of the defined types were placed in this encounter.     Procedures: No procedures performed   Clinical Data: No additional findings.   Subjective: Chief Complaint  Patient presents with  . Right Hip - Pain  Patient presents today for right hip pain. He said that it has been uncomfortable for some time now, but has progressed in the last few weeks. His pain is located on the lateral side of his hip. No groin pain. Occasionally he will have pain that travels down his leg. He works on his feet all day and has noticed that his pain is worse in the evening. He has been taking Aleve or Tylenol as needed. He had his left hip replaced in 2016.    HPI  Review of Systems   Objective: Vital Signs: BP 122/76   Pulse 77   Ht 5\' 11"  (1.803 m)   Wt 195 lb (88.5 kg)   BMI 27.20 kg/m   Physical Exam Constitutional:      Appearance: He is well-developed.  Eyes:     Pupils: Pupils are equal, round, and reactive to light.  Pulmonary:     Effort: Pulmonary effort is normal.  Skin:    General: Skin is  warm and dry.  Neurological:     Mental Status: He is alert and oriented to person, place, and time.  Psychiatric:        Behavior: Behavior normal.     Ortho Exam walks with a limp referable to his right hip.  Does have decreased range of motion and pain on the extreme of internal and external rotation right hip.  No pain whatsoever with his left hip.  Leg lengths symmetrical Specialty Comments:  No specialty comments available.  Imaging: Xr Hip Unilat W Or W/o Pelvis 2-3 Views Right  Result Date: 12/16/2018 AP the pelvis reveals an intact left total hip replacement without complication.  Femoral head is centered in the acetabulum.  Good canal fit on the femoral stem. There are degenerative changes of the right hip with peripheral osteophytes and irregularity of the femoral head.    PMFS History: Patient Active Problem List   Diagnosis Date Noted  . Primary osteoarthritis of left hip 02/21/2015  . 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 Arthritis   . Constipation due to pain medication  History reviewed. No pertinent family history.  Past Surgical History:  Procedure Laterality Date  . COLONOSCOPY W/ POLYPECTOMY    . TOTAL HIP ARTHROPLASTY Left 02/21/2015  . TOTAL HIP ARTHROPLASTY Left 02/21/2015   Procedure: TOTAL LEFT HIP ARTHROPLASTY;  Surgeon: Garald Balding, MD;  Location: [place];  Service: Orthopedics;  Laterality: Left;  Marland Kitchen [place]   Social History   Occupational History  . Not on file  Tobacco Use  . Smoking status: Current Every Day Smoker    Packs/day: 0.50    Years: 20.00    Pack years: 10.00    Types: Cigarettes  . Smokeless tobacco: Never Used  Substance and Sexual Activity  . Alcohol use: Yes    Comment: social  . Drug use: Yes    Types: Marijuana  . Sexual activity: Not on file

## 2018-12-31 ENCOUNTER — Ambulatory Visit: Payer: Self-pay

## 2018-12-31 ENCOUNTER — Encounter: Payer: Self-pay | Admitting: Physical Medicine and Rehabilitation

## 2018-12-31 ENCOUNTER — Ambulatory Visit (INDEPENDENT_AMBULATORY_CARE_PROVIDER_SITE_OTHER): Payer: Medicare Other | Admitting: Physical Medicine and Rehabilitation

## 2018-12-31 DIAGNOSIS — M25551 Pain in right hip: Secondary | ICD-10-CM | POA: Diagnosis not present

## 2018-12-31 NOTE — Progress Notes (Signed)
   Garrett Brown. - 68 y.o. male MRN [id number]  Date of birth: December 14, 1950  Office Visit Note: Visit Date: 12/31/2018 PCP: System, Pcp Not In Referred by: Garald Balding, MD  Subjective: Chief Complaint  Patient presents with  . Right Hip - Pain   HPI:  Garrett Brown. is a 68 y.o. male who comes in today At the request of Dr. Joni Fears for diagnostic and therapeutic anesthetic hip arthrogram on the right.  Patient is 4 years status post left total hip arthroplasty with good results.  Patient is having hip pain and groin pain on the right.  This is been ongoing now for about a year and is just gotten worse over the last several months.  ROS Otherwise per HPI.  Assessment & Plan: Visit Diagnoses: No diagnosis found.  Plan: No additional findings.   Meds & Orders: No orders of the defined types were placed in this encounter.  No orders of the defined types were placed in this encounter.   Follow-up: No follow-ups on file.   Procedures: Large Joint Inj: R hip joint on 12/31/2018 2:01 PM Indications: pain and diagnostic evaluation Details: 22 G needle, anterior approach  Arthrogram: Yes  Medications: 4 mL bupivacaine 0.25 %; 80 mg triamcinolone acetonide 40 MG/ML Outcome: tolerated well, no immediate complications  Arthrogram demonstrated excellent flow of contrast throughout the joint surface without extravasation or obvious defect.  The patient had relief of symptoms during the anesthetic phase of the injection.  Procedure, treatment alternatives, risks and benefits explained, specific risks discussed. Consent was given by the patient. Immediately prior to procedure a time out was called to verify the correct patient, procedure, equipment, support staff and site/side marked as required. Patient was prepped and draped in the usual sterile fashion.      No notes on file   Clinical History: No specialty comments available.     Objective:  VS:  HT:    WT:    BMI:     BP:   HR: bpm  TEMP: ( )  RESP:  Physical Exam  Ortho Exam Imaging: No results found.

## 2018-12-31 NOTE — Progress Notes (Signed)
 .  Numeric Pain Rating Scale and Functional Assessment Average Pain 6   In the last MONTH (on 0-10 scale) has pain interfered with the following?  1. General activity like being  able to carry out your everyday physical activities such as walking, climbing stairs, carrying groceries, or moving a chair?  Rating(6)   -Dye Allergies.  

## 2019-02-03 ENCOUNTER — Telehealth: Payer: Self-pay | Admitting: Orthopaedic Surgery

## 2019-02-03 NOTE — Telephone Encounter (Signed)
Please see below.

## 2019-02-03 NOTE — Telephone Encounter (Signed)
Patient calling to let you know rt hip pain is back, and just as bad as it was before injection. Patient states injection helped about 1 week. Patient ready to proceed with surgery. Please call to advise. Patient going out of town tomorrow, but will have his phone with him if doctor needs to discuss this with him. Please advise patient.

## 2019-02-04 NOTE — Telephone Encounter (Signed)
Will complete surgery form and will have Debbie call him re scheduling-thanks. Will also need clearance form

## 2019-02-04 NOTE — Telephone Encounter (Signed)
I will send this surgical form up to you today. Thanks!

## 2019-02-08 ENCOUNTER — Telehealth: Payer: Self-pay | Admitting: Orthopaedic Surgery

## 2019-02-08 NOTE — Telephone Encounter (Signed)
I called patient, please schedule patient appt w/Dr. Hilts. Thank you.

## 2019-02-08 NOTE — Telephone Encounter (Signed)
Called patient regarding medical clearance.  He states he does not have a PCP and wants to know if there is someone that you recommend. Patient was seen by Dr. Durward Fortes early August and called in recently to say he'd like to proceed with left total hip surgery.

## 2019-02-10 MED ORDER — BUPIVACAINE HCL 0.25 % IJ SOLN
4.0000 mL | INTRAMUSCULAR | Status: AC | PRN
  Administered 2018-12-31: 14:00:00 4 mL via INTRA_ARTICULAR

## 2019-02-10 MED ORDER — TRIAMCINOLONE ACETONIDE 40 MG/ML IJ SUSP
80.0000 mg | INTRAMUSCULAR | Status: AC | PRN
  Administered 2018-12-31: 14:00:00 80 mg via INTRA_ARTICULAR

## 2019-02-12 ENCOUNTER — Encounter: Payer: Self-pay | Admitting: Family Medicine

## 2019-02-12 ENCOUNTER — Ambulatory Visit (INDEPENDENT_AMBULATORY_CARE_PROVIDER_SITE_OTHER): Payer: Medicare Other | Admitting: Family Medicine

## 2019-02-12 VITALS — BP 142/86 | HR 68 | Temp 97.1°F | Resp 16 | Ht 70.5 in | Wt 202.8 lb

## 2019-02-12 DIAGNOSIS — F17200 Nicotine dependence, unspecified, uncomplicated: Secondary | ICD-10-CM | POA: Insufficient documentation

## 2019-02-12 DIAGNOSIS — G8929 Other chronic pain: Secondary | ICD-10-CM

## 2019-02-12 DIAGNOSIS — Z720 Tobacco use: Secondary | ICD-10-CM

## 2019-02-12 DIAGNOSIS — M1611 Unilateral primary osteoarthritis, right hip: Secondary | ICD-10-CM | POA: Diagnosis not present

## 2019-02-12 DIAGNOSIS — M25551 Pain in right hip: Secondary | ICD-10-CM

## 2019-02-12 DIAGNOSIS — Z01818 Encounter for other preprocedural examination: Secondary | ICD-10-CM | POA: Diagnosis not present

## 2019-02-12 DIAGNOSIS — E559 Vitamin D deficiency, unspecified: Secondary | ICD-10-CM

## 2019-02-12 DIAGNOSIS — R030 Elevated blood-pressure reading, without diagnosis of hypertension: Secondary | ICD-10-CM

## 2019-02-12 NOTE — Progress Notes (Signed)
Office Visit Note   Patient: Garrett Brown.           Date of Birth: [birth year]           MRN: [id number] Visit Date: 02/12/2019 Requested by: No referring provider defined for this encounter. PCP: Garrett Mesi, MD  Subjective: Chief Complaint  Patient presents with  . establish primary care - needs clearance for right THA    HPI: He is here for a preoperative clearance visit.  He has not had a PCP in quite a while.  He is struggling with end-stage right hip DJD and did not get much relief from recent injection.  He is hoping to proceed with hip replacement per Dr. Cleophas Brown in the near future.  He had his left hip replaced in 2016 and did very well with that, no complications from anesthesia or surgery.  He has no known medical problems.  He does smoke 1 pack daily and is wanting to quit.  He is never been diagnosed with hypertension before.  He is in quite a bit of pain.  He thinks his blood pressure reading today might be related to that.  Family history is positive for diabetes in a couple family members as well as some heart disease.  Patient has not had any labs drawn since his last surgery.  He plans to have a complete physical at some point after he recovers from his surgery.                ROS: Denies fevers or chills.  Denies cough, chest pain, shortness of breath.  All other systems were reviewed and are negative.  Objective: Vital Signs: BP (!) 142/86 (BP Location: Left Arm, Patient Position: Sitting, Cuff Size: Normal)   Pulse 68   Temp (!) 97.1 F (36.2 C)   Resp 16   Ht 5' 10.5" (1.791 m)   Wt 202 lb 12.8 oz (92 kg)   BMI 28.69 kg/m   Physical Exam:  General:  Alert and oriented, in no acute distress. Pulm:  Breathing unlabored. Psy:  Normal mood, congruent affect. Skin: No visible rash. HEENT:  [place]/AT, PERRLA, EOM Full, no nystagmus.  Funduscopic examination within normal limits.  No conjunctival erythema.  Tympanic membranes are pearly gray with  normal landmarks.  External ear canals are normal.  Nasal passages are clear.  Oropharynx is clear.  No significant lymphadenopathy.  No thyromegaly or nodules.  2+ carotid pulses without bruits. CV: Regular rate and rhythm without murmurs, rubs, or gallops.  No peripheral edema.  2+ radial and posterior tibial pulses. Lungs: Breath sounds are slightly diminished throughout, but lungs are clear to auscultation throughout with no wheezing or areas of consolidation. Extremities: 2+ radial and posterior tibial pulses bilaterally.  Right hip has very limited range of motion and is very painful with internal rotation.    Imaging: None today.  Assessment & Plan: 1.  Preoperative clearance for right hip replacement - His Gupta preoperative cardiac risk score is 0.14%.  He is at low risk for cardiopulmonary complications related to anesthesia, particularly since he did well with his recent surgery in 2016.  He is cleared to proceed as directed by Dr. Cleophas Brown. -We will go ahead and draw some labs today to check blood count, kidney function, etc.  I will also check his vitamin D level and optimize it. -I will see him back at some point for a wellness exam in the next 6 months or so.  Procedures: No procedures performed  No notes on file     PMFS History: Patient Active Problem List   Diagnosis Date Noted  . Primary osteoarthritis of left hip 02/21/2015  . 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 Arthritis   . Constipation due to pain medication     History reviewed. No pertinent family history.  Past Surgical History:  Procedure Laterality Date  . COLONOSCOPY W/ POLYPECTOMY    . TOTAL HIP ARTHROPLASTY Left 02/21/2015  . TOTAL HIP ARTHROPLASTY Left 02/21/2015   Procedure: TOTAL LEFT HIP ARTHROPLASTY;  Surgeon: Garrett Balding, MD;  Location: [place];  Service: Orthopedics;  Laterality: Left;  Marland Kitchen [place]   Social History    Occupational History  . Not on file  Tobacco Use  . Smoking status: Current Every Day Smoker    Packs/day: 0.50    Years: 20.00    Pack years: 10.00    Types: Cigarettes  . Smokeless tobacco: Never Used  Substance and Sexual Activity  . Alcohol use: Yes    Comment: social  . Drug use: Yes    Types: Marijuana  . Sexual activity: Not on file

## 2019-02-13 ENCOUNTER — Telehealth: Payer: Self-pay | Admitting: Family Medicine

## 2019-02-13 LAB — COMPREHENSIVE METABOLIC PANEL
AG Ratio: 2 (calc) (ref 1.0–2.5)
ALT: 10 U/L (ref 9–46)
AST: 10 U/L (ref 10–35)
Albumin: 4.4 g/dL (ref 3.6–5.1)
Alkaline phosphatase (APISO): 53 U/L (ref 35–144)
BUN: 23 mg/dL (ref 7–25)
CO2: 30 mmol/L (ref 20–32)
Calcium: 9.5 mg/dL (ref 8.6–10.3)
Chloride: 105 mmol/L (ref 98–110)
Creat: 0.92 mg/dL (ref 0.70–1.25)
Globulin: 2.2 g/dL (calc) (ref 1.9–3.7)
Glucose, Bld: 135 mg/dL — ABNORMAL HIGH (ref 65–99)
Potassium: 3.9 mmol/L (ref 3.5–5.3)
Sodium: 142 mmol/L (ref 135–146)
Total Bilirubin: 0.5 mg/dL (ref 0.2–1.2)
Total Protein: 6.6 g/dL (ref 6.1–8.1)

## 2019-02-13 LAB — CBC WITH DIFFERENTIAL/PLATELET
Absolute Monocytes: 640 cells/uL (ref 200–950)
Basophils Absolute: 87 cells/uL (ref 0–200)
Basophils Relative: 0.9 %
Eosinophils Absolute: 223 cells/uL (ref 15–500)
Eosinophils Relative: 2.3 %
HCT: 45.5 % (ref 38.5–50.0)
Hemoglobin: 15.2 g/dL (ref 13.2–17.1)
Lymphs Abs: 2347 cells/uL (ref 850–3900)
MCH: 29.9 pg (ref 27.0–33.0)
MCHC: 33.4 g/dL (ref 32.0–36.0)
MCV: 89.4 fL (ref 80.0–100.0)
MPV: 9.8 fL (ref 7.5–12.5)
Monocytes Relative: 6.6 %
Neutro Abs: 6402 cells/uL (ref 1500–7800)
Neutrophils Relative %: 66 %
Platelets: 320 10*3/uL (ref 140–400)
RBC: 5.09 10*6/uL (ref 4.20–5.80)
RDW: 13.5 % (ref 11.0–15.0)
Total Lymphocyte: 24.2 %
WBC: 9.7 10*3/uL (ref 3.8–10.8)

## 2019-02-13 LAB — VITAMIN D 25 HYDROXY (VIT D DEFICIENCY, FRACTURES): Vit D, 25-Hydroxy: 10 ng/mL — ABNORMAL LOW (ref 30–100)

## 2019-02-13 NOTE — Telephone Encounter (Signed)
Labs show:  Blood glucose is elevated at 135.  I'm not sure whether he was fasting, so we won't worry about it right now, but in a couple months we should do fasting glucose and hemoglobin A1C to be sure he's not diabetic.  Vitamin D is severely low at 10 (goal is 50-80).  We need to treat this in order to improve his bone strength.  I recommend:  - Vitamin D3:  5,000 IU tablets, two of them daily for 3 months and then 1 daily long-term after that.  We'll recheck in about 6 months. - Vitamin K2:  100 mcg daily - Magnesium:  200-400 mg daily  Other labs looked good.

## 2019-02-16 ENCOUNTER — Telehealth: Payer: Self-pay | Admitting: Orthopaedic Surgery

## 2019-02-16 ENCOUNTER — Other Ambulatory Visit: Payer: Self-pay | Admitting: Orthopedic Surgery

## 2019-02-16 MED ORDER — HYDROCODONE-ACETAMINOPHEN 5-325 MG PO TABS: 1.0000 | ORAL_TABLET | Freq: Three times a day (TID) | ORAL | 0 refills | Status: DC | PRN

## 2019-02-16 NOTE — Telephone Encounter (Signed)
Rx sent in to pharmacy for hydrocodone.Marland Kitchen

## 2019-02-16 NOTE — Telephone Encounter (Signed)
Patient left a voicemail stating he had an appointment with Dr. Junius Roads on Friday, 02/12/19 and was cleared for surgery.  Patient states he is still experiencing a lot of pain and is requesting pain medication.

## 2019-02-16 NOTE — Telephone Encounter (Signed)
Please see below.Marland KitchenMarland KitchenCalled patient. He is aware that hydrocodone has been sent in to pharmacy. He is wanting to move forward with surgery as quickly as possible.

## 2019-02-16 NOTE — Telephone Encounter (Signed)
I advised the patient of the results. He was in his car when I called, so I told him I would call him back tomorrow morning with the vitamin regimen instructions.

## 2019-02-16 NOTE — Telephone Encounter (Signed)
Please advise 

## 2019-02-17 NOTE — Telephone Encounter (Signed)
I called and left full instructions on the vitamins on the patient's voice mail. I advised him to give me a call back if he needs any clarification or other information on this.

## 2019-02-23 ENCOUNTER — Ambulatory Visit (INDEPENDENT_AMBULATORY_CARE_PROVIDER_SITE_OTHER): Payer: Medicare Other | Admitting: Orthopaedic Surgery

## 2019-02-23 ENCOUNTER — Encounter: Payer: Self-pay | Admitting: Orthopaedic Surgery

## 2019-02-23 ENCOUNTER — Other Ambulatory Visit: Payer: Self-pay

## 2019-02-23 DIAGNOSIS — M1611 Unilateral primary osteoarthritis, right hip: Secondary | ICD-10-CM | POA: Diagnosis not present

## 2019-02-23 NOTE — H&P (Addendum)
TOTAL HIP ADMISSION H&P  Patient is admitted for right total hip arthroplasty.  Subjective:  Chief Complaint: right hip pain  HPI: Garrett Hane., 68 y.o. male, has a history of pain and functional disability in the right hip(s) due to arthritis and patient has failed non-surgical conservative treatments for greater than 12 weeks to include NSAID's and/or analgesics, corticosteriod injections, flexibility and strengthening excercises, use of assistive devices and activity modification.  Onset of symptoms was gradual starting 5 years ago with rapidlly worsening course since that time.The patient noted no past surgery on the right hip(s).  Patient currently rates pain in the right hip at 8 out of 10 with activity. Patient has night pain, worsening of pain with activity and weight bearing, pain that interfers with activities of daily living and pain with passive range of motion. Patient has evidence of subchondral cysts, subchondral sclerosis, periarticular osteophytes and joint space narrowing by imaging studies. This condition presents safety issues increasing the risk of falls. There is no current active infection.  Patient Active Problem List   Diagnosis Date Noted  . Primary osteoarthritis of right hip 02/23/2019  . Tobacco abuse 02/12/2019  . 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 Arthritis   . Constipation due to pain medication     Past Surgical History:  Procedure Laterality Date  . COLONOSCOPY W/ POLYPECTOMY    . TOTAL HIP ARTHROPLASTY Left 02/21/2015  . TOTAL HIP ARTHROPLASTY Left 02/21/2015   Procedure: TOTAL LEFT HIP ARTHROPLASTY;  Surgeon: Garald Balding, MD;  Location: [facility];  Service: Orthopedics;  Laterality: Left;  Marland Kitchen VARICOCELE EXCISION  1989    Current Facility-Administered Medications  Medication Dose Route Frequency Provider Last Rate Last Dose  . [START ON 03/09/2019] tranexamic acid (CYKLOKAPRON) 2,000 mg in sodium  chloride 0.9 % 50 mL Topical Application  8,099 mg Topical To OR Garald Balding, MD       Current Outpatient Medications  Medication Sig Dispense Refill Last Dose  . HYDROcodone-acetaminophen (NORCO/VICODIN) 5-325 MG tablet Take 1 tablet by mouth every 8 (eight) hours as needed for moderate pain or severe pain. 20 tablet 0   . naproxen sodium (ALEVE) 220 MG tablet Take 440 mg by mouth daily as needed (pain).       No Known Allergies  Social History   Tobacco Use  . Smoking status: Current Every Day Smoker    Packs/day: 0.50    Years: 20.00    Pack years: 10.00    Types: Cigarettes  . Smokeless tobacco: Never Used  Substance Use Topics  . Alcohol use: Yes    Comment: social    No family history on file.   ROS  Review of Systems  Constitutional: Negative for fatigue.  HENT: Negative for ear pain.   Eyes: Negative for pain.  Respiratory: Negative for shortness of breath.   Cardiovascular: Negative for leg swelling.  Gastrointestinal: Negative for constipation and diarrhea.  Endocrine: Negative for cold intolerance and heat intolerance.  Genitourinary: Negative for difficulty urinating.  Musculoskeletal: Negative for joint swelling.  Skin: Negative for rash.  Allergic/Immunologic: Negative for food allergies.  Neurological: Negative for weakness.  Hematological: Does not bruise/bleed easily.  Psychiatric/Behavioral: Positive for sleep disturbance.   Objective:  Physical Exam  Constitutional: He is oriented to person, place, and time. He appears well-developed and well-nourished.  HENT:  Head: Normocephalic and atraumatic.  Eyes: Pupils are equal, round, and reactive to light. Conjunctivae and  EOM are normal.  Neck: Normal range of motion. Neck supple.  Cardiovascular: Normal rate, regular rhythm, normal heart sounds and intact distal pulses.  No murmur heard. Respiratory: Effort normal and breath sounds normal.  GI: Soft. Bowel sounds are normal. He exhibits no  distension. There is no abdominal tenderness.  Neurological: He is alert and oriented to person, place, and time.  Skin: Skin is warm and dry.  Psychiatric: He has a normal mood and affect. His behavior is normal. Judgment and thought content normal.  Musculoskeletal: limited IR/ER right hip with pain.  Flexes to 90 degrees  Vital signs in last 24 hours: Estimated body mass index is 27.9 kg/m as calculated from the following:   Height as of 03/05/19: 5' 10.5" (1.791 m).   Weight as of 03/05/19: 89.4 kg.   Imaging Review Plain radiographs demonstrate moderate degenerative joint disease of the right hip(s). The bone quality appears to be good for age and reported activity level.      Assessment/Plan:  End stage arthritis, right hip(s)  The patient history, physical examination, clinical judgement of the provider and imaging studies are consistent with end stage degenerative joint disease of the right hip(s) and total hip arthroplasty is deemed medically necessary. The treatment options including medical management, injection therapy, arthroscopy and arthroplasty were discussed at length. The risks and benefits of total hip arthroplasty were presented and reviewed. The risks due to aseptic loosening, infection, stiffness, dislocation/subluxation,  thromboembolic complications and other imponderables were discussed.  The patient acknowledged the explanation, agreed to proceed with the plan and consent was signed. Patient is being admitted for inpatient treatment for surgery, pain control, PT, OT, prophylactic antibiotics, VTE prophylaxis, progressive ambulation and ADL's and discharge planning.The patient is planning to be discharged home with home health services   [name]. Daphine Deutscher [phone]  03/08/2019 10:46 AM

## 2019-02-23 NOTE — Progress Notes (Signed)
Office Visit Note   Patient: Garrett Brown.           Date of Birth: [birth year]           MRN: [id number] Visit Date: 02/23/2019              Requested by: Eunice Blase, MD [address] [place],  [place] [address] PCP: Eunice Blase, MD   Chief Complaint: right hip pain  HPI: Desiree Hane., 68 y.o. male, has a history of pain and functional disability in the right hip(s) due to arthritis and patient has failed non-surgical conservative treatments for greater than 12 weeks to include NSAID's and/or analgesics, corticosteriod injections, flexibility and strengthening excercises, use of assistive devices and activity modification.  Onset of symptoms was gradual starting 5 years ago with rapidlly worsening course since that time.The patient noted no past surgery on the right hip(s).  Patient currently rates pain in the right hip at 8 out of 10 with activity. Patient has night pain, worsening of pain with activity and weight bearing, pain that interfers with activities of daily living and pain with passive range of motion. Patient has evidence of subchondral cysts, subchondral sclerosis, periarticular osteophytes and joint space narrowing by imaging studies. This condition presents safety issues increasing the risk of falls. There is no current active infection.  Patient Active Problem List   Diagnosis Date Noted  . Tobacco abuse 02/12/2019  . 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 Arthritis   . Constipation due to pain medication     Past Surgical History:  Procedure Laterality Date  . COLONOSCOPY W/ POLYPECTOMY    . TOTAL HIP ARTHROPLASTY Left 02/21/2015  . TOTAL HIP ARTHROPLASTY Left 02/21/2015   Procedure: TOTAL LEFT HIP ARTHROPLASTY;  Surgeon: Garald Balding, MD;  Location: [facility];  Service: Orthopedics;  Laterality: Left;  Marland Kitchen VARICOCELE EXCISION  1989    No current facility-administered medications for this encounter.     Current Outpatient Medications  Medication Sig Dispense Refill Last Dose  . acetaminophen (TYLENOL) 325 MG tablet Take 650 mg by mouth every 6 (six) hours as needed.   Taking  . HYDROcodone-acetaminophen (NORCO/VICODIN) 5-325 MG tablet Take 1 tablet by mouth every 8 (eight) hours as needed for moderate pain or severe pain. 20 tablet 0   . methocarbamol (ROBAXIN) 500 MG tablet Take 1 tablet (500 mg total) by mouth every 8 (eight) hours as needed for muscle spasms. 40 tablet 0 Taking  . naproxen sodium (ALEVE) 220 MG tablet Take 220 mg by mouth.   Taking  . oxyCODONE (OXY IR/ROXICODONE) 5 MG immediate release tablet Take 1-2 tablets (5-10 mg total) by mouth every 4 (four) hours as needed for moderate pain or severe pain. 90 tablet 0 Taking   No Known Allergies  Social History   Tobacco Use  . Smoking status: Current Every Day Smoker    Packs/day: 0.50    Years: 20.00    Pack years: 10.00    Types: Cigarettes  . Smokeless tobacco: Never Used  Substance Use Topics  . Alcohol use: Yes    Comment: social    No family history on file.   ROS  Review of Systems  Constitutional: Negative for fatigue.  HENT: Negative for ear pain.   Eyes: Negative for pain.  Respiratory: Negative for shortness of breath.   Cardiovascular: Negative for leg swelling.  Gastrointestinal: Negative for constipation and diarrhea.  Endocrine:  Negative for cold intolerance and heat intolerance.  Genitourinary: Negative for difficulty urinating.  Musculoskeletal: Negative for joint swelling.  Skin: Negative for rash.  Allergic/Immunologic: Negative for food allergies.  Neurological: Negative for weakness.  Hematological: Does not bruise/bleed easily.  Psychiatric/Behavioral: Positive for sleep disturbance.   Objective:  Physical Exam  Constitutional: He is oriented to person, place, and time. He appears well-developed and well-nourished.  HENT:  Head: Normocephalic and atraumatic.  Eyes: Pupils are equal,  round, and reactive to light. Conjunctivae and EOM are normal.  Neck: Normal range of motion. Neck supple.  Cardiovascular: Normal rate, regular rhythm, normal heart sounds and intact distal pulses.  No murmur heard. Respiratory: Effort normal and breath sounds normal.  GI: Soft. Bowel sounds are normal. He exhibits no distension. There is no abdominal tenderness.  Neurological: He is alert and oriented to person, place, and time.  Skin: Skin is warm and dry.  Psychiatric: He has a normal mood and affect. His behavior is normal. Judgment and thought content normal.  Musculoskeletal: limited IR/ER right hip with pain.  Flexes to 90 degrees  Vital signs in last 24 hours: Estimated body mass index is 28.69 kg/m as calculated from the following:   Height as of 02/12/19: 5' 10.5" (1.791 m).   Weight as of 02/12/19: 92 kg.   Imaging Review Plain radiographs demonstrate moderate degenerative joint disease of the right hip(s). The bone quality appears to be good for age and reported activity level.      Assessment/Plan:  End stage arthritis, right hip(s)  The patient history, physical examination, clinical judgement of the provider and imaging studies are consistent with end stage degenerative joint disease of the right hip(s) and total hip arthroplasty is deemed medically necessary. The treatment options including medical management, injection therapy, arthroscopy and arthroplasty were discussed at length. The risks and benefits of total hip arthroplasty were presented and reviewed. The risks due to aseptic loosening, infection, stiffness, dislocation/subluxation,  thromboembolic complications and other imponderables were discussed.  The patient acknowledged the explanation, agreed to proceed with the plan and consent was signed. Patient is being admitted for inpatient treatment for surgery, pain control, PT, OT, prophylactic antibiotics, VTE prophylaxis, progressive ambulation and ADL's and  discharge planning.The patient is planning to be discharged home with home health services

## 2019-03-04 NOTE — Patient Instructions (Addendum)
DUE TO COVID-19 ONLY ONE VISITOR IS ALLOWED TO COME WITH YOU AND STAY IN THE WAITING ROOM ONLY DURING PRE OP AND PROCEDURE DAY OF SURGERY. THE 1 VISITOR MAY VISIT WITH YOU AFTER SURGERY IN YOUR PRIVATE ROOM DURING VISITING HOURS ONLY!   ONCE YOUR COVID TEST IS COMPLETED, PLEASE BEGIN THE QUARANTINE INSTRUCTIONS AS OUTLINED IN YOUR HANDOUT.                Garrett Brown.    Your procedure is scheduled on: Tuesday 03/09/2019   Report to [facility] Main  Entrance    Report to Short Stay at 0530  AM                Remember :Do not smoke 24 hours before surgery or smoke any marijuana three days before surgery!   Call this number if you have problems the morning of surgery [phone]    Remember: Do not eat food  :After Midnight.     NO SOLID FOOD AFTER MIDNIGHT THE NIGHT PRIOR TO SURGERY. NOTHING BY MOUTH EXCEPT CLEAR LIQUIDS UNTIL  0415 am .    PLEASE FINISH ENSURE DRINK PER SURGEON ORDER  WHICH NEEDS TO BE COMPLETED AT  0415 am .   CLEAR LIQUID DIET   Foods Allowed                                                                     Foods Excluded  Coffee and tea, regular and decaf                             liquids that you cannot  Plain Jell-O any favor except red or purple                                           see through such as: Fruit ices (not with fruit pulp)                                     milk, soups, orange juice  Iced Popsicles                                    All solid food Carbonated beverages, regular and diet                                    Cranberry, grape and apple juices Sports drinks like Gatorade Lightly seasoned clear broth or consume(fat free) Sugar, honey syrup  Sample Menu Breakfast                                Lunch  Supper Cranberry juice                    Beef broth                            Chicken broth Jell-O                                     Grape juice                            Apple juice Coffee or tea                        Jell-O                                      Popsicle                                                Coffee or tea                        Coffee or tea  _____________________________________________________________________     BRUSH YOUR TEETH MORNING OF SURGERY AND RINSE YOUR MOUTH OUT, NO CHEWING GUM CANDY OR MINTS.     Take these medicines the morning of surgery with A SIP OF WATER: none                                 You may not have any metal on your body including hair pins and              piercings  Do not wear jewelry, make-up, lotions, powders or perfumes, deodorant                          Men may shave face and neck.   Do not bring valuables to the hospital. [place].  Contacts, dentures or bridgework may not be worn into surgery.  Leave suitcase in the car. After surgery it may be brought to your room.                  Please read over the following fact sheets you were given: _____________________________________________________________________             [facility] - Preparing for Surgery Before surgery, you can play an important role.  Because skin is not sterile, your skin needs to be as free of germs as possible.  You can reduce the number of germs on your skin by washing with CHG (chlorahexidine gluconate) soap before surgery.  CHG is an antiseptic cleaner which kills germs and bonds with the skin to continue killing germs even after washing. Please DO NOT use if you have an allergy to CHG or antibacterial soaps.  If your skin becomes reddened/irritated stop using the CHG and inform your nurse when  you arrive at Short Stay. Do not shave (including legs and underarms) for at least 48 hours prior to the first CHG shower.  You may shave your face/neck. Please follow these instructions carefully:  1.  Shower with CHG Soap the night before surgery and the  morning  of Surgery.  2.  If you choose to wash your hair, wash your hair first as usual with your  normal  shampoo.  3.  After you shampoo, rinse your hair and body thoroughly to remove the  shampoo.                           4.  Use CHG as you would any other liquid soap.  You can apply chg directly  to the skin and wash                       Gently with a scrungie or clean washcloth.  5.  Apply the CHG Soap to your body ONLY FROM THE NECK DOWN.   Do not use on face/ open                           Wound or open sores. Avoid contact with eyes, ears mouth and genitals (private parts).                       Wash face,  Genitals (private parts) with your normal soap.             6.  Wash thoroughly, paying special attention to the area where your surgery  will be performed.  7.  Thoroughly rinse your body with warm water from the neck down.  8.  DO NOT shower/wash with your normal soap after using and rinsing off  the CHG Soap.                9.  Pat yourself dry with a clean towel.            10.  Wear clean pajamas.            11.  Place clean sheets on your bed the night of your first shower and do not  sleep with pets. Day of Surgery : Do not apply any lotions/deodorants the morning of surgery.  Please wear clean clothes to the hospital/surgery center.  FAILURE TO FOLLOW THESE INSTRUCTIONS MAY RESULT IN THE CANCELLATION OF YOUR SURGERY PATIENT SIGNATURE_________________________________  NURSE SIGNATURE__________________________________  ________________________________________________________________________   Garrett MireIncentive Spirometer  An incentive spirometer is a tool that can help keep your lungs clear and active. This tool measures how well you are filling your lungs with each breath. Taking long deep breaths may help reverse or decrease the chance of developing breathing (pulmonary) problems (especially infection) following:  A long period of time when you are unable to move or be  active. BEFORE THE PROCEDURE   If the spirometer includes an indicator to show your best effort, your nurse or respiratory therapist will set it to a desired goal.  If possible, sit up straight or lean slightly forward. Try not to slouch.  Hold the incentive spirometer in an upright position. INSTRUCTIONS FOR USE  1. Sit on the edge of your bed if possible, or sit up as far as you can in bed or on a chair. 2. Hold the incentive spirometer in an upright position. 3. Breathe out  normally. 4. Place the mouthpiece in your mouth and seal your lips tightly around it. 5. Breathe in slowly and as deeply as possible, raising the piston or the ball toward the top of the column. 6. Hold your breath for 3-5 seconds or for as long as possible. Allow the piston or ball to fall to the bottom of the column. 7. Remove the mouthpiece from your mouth and breathe out normally. 8. Rest for a few seconds and repeat Steps 1 through 7 at least 10 times every 1-2 hours when you are awake. Take your time and take a few normal breaths between deep breaths. 9. The spirometer may include an indicator to show your best effort. Use the indicator as a goal to work toward during each repetition. 10. After each set of 10 deep breaths, practice coughing to be sure your lungs are clear. If you have an incision (the cut made at the time of surgery), support your incision when coughing by placing a pillow or rolled up towels firmly against it. Once you are able to get out of bed, walk around indoors and cough well. You may stop using the incentive spirometer when instructed by your caregiver.  RISKS AND COMPLICATIONS  Take your time so you do not get dizzy or light-headed.  If you are in pain, you may need to take or ask for pain medication before doing incentive spirometry. It is harder to take a deep breath if you are having pain. AFTER USE  Rest and breathe slowly and easily.  It can be helpful to keep track of a log of  your progress. Your caregiver can provide you with a simple table to help with this. If you are using the spirometer at home, follow these instructions: SEEK MEDICAL CARE IF:   You are having difficultly using the spirometer.  You have trouble using the spirometer as often as instructed.  Your pain medication is not giving enough relief while using the spirometer.  You develop fever of 100.5 F (38.1 C) or higher. SEEK IMMEDIATE MEDICAL CARE IF:   You cough up bloody sputum that had not been present before.  You develop fever of 102 F (38.9 C) or greater.  You develop worsening pain at or near the incision site. MAKE SURE YOU:   Understand these instructions.  Will watch your condition.  Will get help right away if you are not doing well or get worse. Document Released: 09/09/2006 Document Revised: 07/22/2011 Document Reviewed: 11/10/2006 Exit[employer] Patient Information 2014 [employer], [place].   ________________________________________________________________________  WHAT IS A BLOOD TRANSFUSION? Blood Transfusion Information  A transfusion is the replacement of blood or some of its parts. Blood is made up of multiple cells which provide different functions.  Red blood cells carry oxygen and are used for blood loss replacement.  White blood cells fight against infection.  Platelets control bleeding.  Plasma helps clot blood.  Other blood products are available for specialized needs, such as hemophilia or other clotting disorders. BEFORE THE TRANSFUSION  Who gives blood for transfusions?   Healthy volunteers who are fully evaluated to make sure their blood is safe. This is blood bank blood. Transfusion therapy is the safest it has ever been in the practice of medicine. Before blood is taken from a donor, a complete history is taken to make sure that person has no history of diseases nor engages in risky social behavior (examples are intravenous drug use or sexual activity  with multiple partners). The donor's travel  history is screened to minimize risk of transmitting infections, such as malaria. The donated blood is tested for signs of infectious diseases, such as HIV and hepatitis. The blood is then tested to be sure it is compatible with you in order to minimize the chance of a transfusion reaction. If you or a relative donates blood, this is often done in anticipation of surgery and is not appropriate for emergency situations. It takes many days to process the donated blood. RISKS AND COMPLICATIONS Although transfusion therapy is very safe and saves many lives, the main dangers of transfusion include:   Getting an infectious disease.  Developing a transfusion reaction. This is an allergic reaction to something in the blood you were given. Every precaution is taken to prevent this. The decision to have a blood transfusion has been considered carefully by your caregiver before blood is given. Blood is not given unless the benefits outweigh the risks. AFTER THE TRANSFUSION  Right after receiving a blood transfusion, you will usually feel much better and more energetic. This is especially true if your red blood cells have gotten low (anemic). The transfusion raises the level of the red blood cells which carry oxygen, and this usually causes an energy increase.  The nurse administering the transfusion will monitor you carefully for complications. HOME CARE INSTRUCTIONS  No special instructions are needed after a transfusion. You may find your energy is better. Speak with your caregiver about any limitations on activity for underlying diseases you may have. SEEK MEDICAL CARE IF:   Your condition is not improving after your transfusion.  You develop redness or irritation at the intravenous (IV) site. SEEK IMMEDIATE MEDICAL CARE IF:  Any of the following symptoms occur over the next 12 hours:  Shaking chills.  You have a temperature by mouth above 102 F (38.9  C), not controlled by medicine.  Chest, back, or muscle pain.  People around you feel you are not acting correctly or are confused.  Shortness of breath or difficulty breathing.  Dizziness and fainting.  You get a rash or develop hives.  You have a decrease in urine output.  Your urine turns a dark color or changes to pink, red, or brown. Any of the following symptoms occur over the next 10 days:  You have a temperature by mouth above 102 F (38.9 C), not controlled by medicine.  Shortness of breath.  Weakness after normal activity.  The white part of the eye turns yellow (jaundice).  You have a decrease in the amount of urine or are urinating less often.  Your urine turns a dark color or changes to pink, red, or brown. Document Released: 04/26/2000 Document Revised: 07/22/2011 Document Reviewed: 12/14/2007 [facility] Patient Information 2014 [place], [place].  _______________________________________________________________________

## 2019-03-05 ENCOUNTER — Encounter (HOSPITAL_COMMUNITY): Payer: Self-pay

## 2019-03-05 ENCOUNTER — Other Ambulatory Visit (HOSPITAL_COMMUNITY)
Admission: RE | Admit: 2019-03-05 | Discharge: 2019-03-05 | Disposition: A | Discharge status: Home / Self Care | Payer: Medicare Other | Source: Ambulatory Visit

## 2019-03-05 ENCOUNTER — Ambulatory Visit (HOSPITAL_COMMUNITY)
Admission: RE | Admit: 2019-03-05 | Discharge: 2019-03-05 | Disposition: A | Discharge status: Home / Self Care | Payer: Medicare Other | Source: Ambulatory Visit | Attending: Orthopedic Surgery | Admitting: Orthopedic Surgery

## 2019-03-05 ENCOUNTER — Other Ambulatory Visit (HOSPITAL_COMMUNITY): Payer: Medicare Other

## 2019-03-05 ENCOUNTER — Encounter (HOSPITAL_COMMUNITY)
Admission: RE | Admit: 2019-03-05 | Discharge: 2019-03-05 | Disposition: A | Discharge status: Home / Self Care | Payer: Medicare Other | Source: Ambulatory Visit | Attending: Orthopaedic Surgery | Admitting: Orthopaedic Surgery

## 2019-03-05 ENCOUNTER — Other Ambulatory Visit: Payer: Self-pay

## 2019-03-05 DIAGNOSIS — Z7982 Long term (current) use of aspirin: Secondary | ICD-10-CM | POA: Diagnosis not present

## 2019-03-05 DIAGNOSIS — M1611 Unilateral primary osteoarthritis, right hip: Secondary | ICD-10-CM | POA: Diagnosis present

## 2019-03-05 DIAGNOSIS — Z20828 Contact with and (suspected) exposure to other viral communicable diseases: Secondary | ICD-10-CM | POA: Diagnosis not present

## 2019-03-05 DIAGNOSIS — Z96642 Presence of left artificial hip joint: Secondary | ICD-10-CM | POA: Diagnosis not present

## 2019-03-05 DIAGNOSIS — Z01818 Encounter for other preprocedural examination: Secondary | ICD-10-CM | POA: Insufficient documentation

## 2019-03-05 DIAGNOSIS — Z86010 Personal history of colonic polyps: Secondary | ICD-10-CM | POA: Diagnosis not present

## 2019-03-05 DIAGNOSIS — F17210 Nicotine dependence, cigarettes, uncomplicated: Secondary | ICD-10-CM | POA: Diagnosis not present

## 2019-03-05 LAB — CBC WITH DIFFERENTIAL/PLATELET
Abs Immature Granulocytes: 0.02 10*3/uL (ref 0.00–0.07)
Basophils Absolute: 0.1 10*3/uL (ref 0.0–0.1)
Basophils Relative: 1 %
Eosinophils Absolute: 0.3 10*3/uL (ref 0.0–0.5)
Eosinophils Relative: 3 %
HCT: 43.9 % (ref 39.0–52.0)
Hemoglobin: 14.2 g/dL (ref 13.0–17.0)
Immature Granulocytes: 0 %
Lymphocytes Relative: 23 %
Lymphs Abs: 2.5 10*3/uL (ref 0.7–4.0)
MCH: 30 pg (ref 26.0–34.0)
MCHC: 32.3 g/dL (ref 30.0–36.0)
MCV: 92.8 fL (ref 80.0–100.0)
Monocytes Absolute: 0.7 10*3/uL (ref 0.1–1.0)
Monocytes Relative: 7 %
Neutro Abs: 7.1 10*3/uL (ref 1.7–7.7)
Neutrophils Relative %: 66 %
Platelets: 292 10*3/uL (ref 150–400)
RBC: 4.73 MIL/uL (ref 4.22–5.81)
RDW: 14.4 % (ref 11.5–15.5)
WBC: 10.6 10*3/uL — ABNORMAL HIGH (ref 4.0–10.5)
nRBC: 0 % (ref 0.0–0.2)

## 2019-03-05 LAB — PROTIME-INR
INR: 1 (ref 0.8–1.2)
Prothrombin Time: 13.4 seconds (ref 11.4–15.2)

## 2019-03-05 LAB — COMPREHENSIVE METABOLIC PANEL
ALT: 19 U/L (ref 0–44)
AST: 17 U/L (ref 15–41)
Albumin: 4 g/dL (ref 3.5–5.0)
Alkaline Phosphatase: 48 U/L (ref 38–126)
Anion gap: 9 (ref 5–15)
BUN: 20 mg/dL (ref 8–23)
CO2: 24 mmol/L (ref 22–32)
Calcium: 8.8 mg/dL — ABNORMAL LOW (ref 8.9–10.3)
Chloride: 105 mmol/L (ref 98–111)
Creatinine, Ser: 0.89 mg/dL (ref 0.61–1.24)
GFR calc Af Amer: >60 mL/min (ref >60)
GFR calc non Af Amer: >60 mL/min (ref >60)
Glucose, Bld: 111 mg/dL — ABNORMAL HIGH (ref 70–99)
Potassium: 3.7 mmol/L (ref 3.5–5.1)
Sodium: 138 mmol/L (ref 135–145)
Total Bilirubin: 0.4 mg/dL (ref 0.3–1.2)
Total Protein: 6.7 g/dL (ref 6.5–8.1)

## 2019-03-05 LAB — URINALYSIS, ROUTINE W REFLEX MICROSCOPIC
Bacteria, UA: NONE SEEN
Bilirubin Urine: NEGATIVE
Glucose, UA: NEGATIVE mg/dL
Ketones, ur: NEGATIVE mg/dL
Nitrite: NEGATIVE
Protein, ur: NEGATIVE mg/dL
Specific Gravity, Urine: 1.027 (ref 1.005–1.030)
pH: 5 (ref 5.0–8.0)

## 2019-03-05 LAB — SURGICAL PCR SCREEN
MRSA, PCR: NEGATIVE
Staphylococcus aureus: POSITIVE — AB

## 2019-03-05 LAB — APTT: aPTT: 29 seconds (ref 24–36)

## 2019-03-05 LAB — ABO/RH: ABO/RH(D): A POS

## 2019-03-05 NOTE — Progress Notes (Addendum)
PCP - Dr. Eunice Blase Cardiologist -none   Chest x-ray - 03/05/2019 EKG - 03/05/2019 Stress Test - none ECHO - none Cardiac Cath - none  Sleep Study - none CPAP - none  Fasting Blood Sugar - none Checks Blood Sugar ___0__ times a day  Blood Thinner Instructions:none Aspirin Instructions:none Last Dose:  Anesthesia review:   Chart to be reviewed by Willeen Cass, PA !  Patient denies shortness of breath, fever, cough and chest pain at PAT appointment   Patient verbalized understanding of instructions that were given to them at the PAT appointment. Patient was also instructed that they will need to review over the PAT instructions again at home before surgery.

## 2019-03-07 LAB — URINE CULTURE
Culture: <10000 — AB
Special Requests: NORMAL

## 2019-03-08 LAB — NOVEL CORONAVIRUS, NAA (HOSP ORDER, SEND-OUT TO REF LAB; TAT 18-24 HRS): SARS-CoV-2, NAA: NOT DETECTED

## 2019-03-08 MED ORDER — TRANEXAMIC ACID 1000 MG/10ML IV SOLN
2000.0000 mg | INTRAVENOUS | Status: DC
  Filled 2019-03-08: qty 20

## 2019-03-09 ENCOUNTER — Other Ambulatory Visit: Payer: Self-pay

## 2019-03-09 ENCOUNTER — Ambulatory Visit (HOSPITAL_COMMUNITY): Payer: Medicare Other | Admitting: Anesthesiology

## 2019-03-09 ENCOUNTER — Encounter (HOSPITAL_COMMUNITY)
Admission: RE | Disposition: A | Discharge status: Home Health | Payer: Self-pay | Source: Home / Self Care | Attending: Orthopaedic Surgery

## 2019-03-09 ENCOUNTER — Ambulatory Visit (HOSPITAL_COMMUNITY)
Admission: RE | Admit: 2019-03-09 | Discharge: 2019-03-10 | Disposition: A | Discharge status: Home Health | Payer: Medicare Other | Attending: Orthopaedic Surgery | Admitting: Orthopaedic Surgery

## 2019-03-09 ENCOUNTER — Encounter (HOSPITAL_COMMUNITY): Payer: Self-pay | Admitting: Emergency Medicine

## 2019-03-09 ENCOUNTER — Ambulatory Visit (HOSPITAL_COMMUNITY): Payer: Medicare Other

## 2019-03-09 DIAGNOSIS — Z96642 Presence of left artificial hip joint: Secondary | ICD-10-CM | POA: Diagnosis not present

## 2019-03-09 DIAGNOSIS — M1611 Unilateral primary osteoarthritis, right hip: Secondary | ICD-10-CM | POA: Diagnosis present

## 2019-03-09 DIAGNOSIS — Z9889 Other specified postprocedural states: Secondary | ICD-10-CM

## 2019-03-09 DIAGNOSIS — F17210 Nicotine dependence, cigarettes, uncomplicated: Secondary | ICD-10-CM | POA: Insufficient documentation

## 2019-03-09 DIAGNOSIS — Z20828 Contact with and (suspected) exposure to other viral communicable diseases: Secondary | ICD-10-CM | POA: Diagnosis not present

## 2019-03-09 DIAGNOSIS — Z86010 Personal history of colonic polyps: Secondary | ICD-10-CM | POA: Insufficient documentation

## 2019-03-09 DIAGNOSIS — Z7982 Long term (current) use of aspirin: Secondary | ICD-10-CM | POA: Insufficient documentation

## 2019-03-09 HISTORY — PX: TOTAL HIP ARTHROPLASTY: SHX124

## 2019-03-09 LAB — TYPE AND SCREEN
ABO/RH(D): A POS
Antibody Screen: NEGATIVE

## 2019-03-09 SURGERY — ARTHROPLASTY, HIP, TOTAL,POSTERIOR APPROACH
Anesthesia: Monitor Anesthesia Care | Site: Hip | Laterality: Right

## 2019-03-09 MED ORDER — MENTHOL 3 MG MT LOZG: 1.0000 | LOZENGE | OROMUCOSAL | Status: DC | PRN

## 2019-03-09 MED ORDER — OXYCODONE HCL 5 MG/5ML PO SOLN: 5.0000 mg | Freq: Once | ORAL | Status: DC | PRN

## 2019-03-09 MED ORDER — EPHEDRINE 5 MG/ML INJ
INTRAVENOUS | Status: AC
  Filled 2019-03-09: qty 10

## 2019-03-09 MED ORDER — DIPHENHYDRAMINE HCL 12.5 MG/5ML PO ELIX: 12.5000 mg | ORAL_SOLUTION | ORAL | Status: DC | PRN

## 2019-03-09 MED ORDER — PROPOFOL 10 MG/ML IV BOLUS
INTRAVENOUS | Status: AC
  Filled 2019-03-09: qty 20

## 2019-03-09 MED ORDER — ACETAMINOPHEN 10 MG/ML IV SOLN
1000.0000 mg | Freq: Four times a day (QID) | INTRAVENOUS | Status: DC
  Administered 2019-03-09: 1000 mg via INTRAVENOUS
  Filled 2019-03-09: qty 100

## 2019-03-09 MED ORDER — CHLORHEXIDINE GLUCONATE CLOTH 2 % EX PADS
6.0000 | MEDICATED_PAD | Freq: Every day | CUTANEOUS | Status: DC
  Administered 2019-03-09: 6 via TOPICAL

## 2019-03-09 MED ORDER — SODIUM CHLORIDE 0.9 % IV SOLN
75.0000 mL/h | INTRAVENOUS | Status: DC
  Administered 2019-03-09 – 2019-03-10 (×2): 75 mL/h via INTRAVENOUS

## 2019-03-09 MED ORDER — HYDROMORPHONE HCL 1 MG/ML IJ SOLN: 0.5000 mg | INTRAMUSCULAR | Status: DC | PRN

## 2019-03-09 MED ORDER — BUPIVACAINE-EPINEPHRINE 0.5% -1:200000 IJ SOLN
INTRAMUSCULAR | Status: DC | PRN
  Administered 2019-03-09: 50 mL

## 2019-03-09 MED ORDER — SODIUM CHLORIDE 0.9 % IV SOLN: 75.0000 mL/h | INTRAVENOUS | Status: DC

## 2019-03-09 MED ORDER — MIDAZOLAM HCL 5 MG/5ML IJ SOLN
INTRAMUSCULAR | Status: DC | PRN
  Administered 2019-03-09 (×2): 1 mg via INTRAVENOUS

## 2019-03-09 MED ORDER — ONDANSETRON HCL 4 MG/2ML IJ SOLN: 4.0000 mg | Freq: Four times a day (QID) | INTRAMUSCULAR | Status: DC | PRN

## 2019-03-09 MED ORDER — ALUM & MAG HYDROXIDE-SIMETH 200-200-20 MG/5ML PO SUSP: 30.0000 mL | ORAL | Status: DC | PRN

## 2019-03-09 MED ORDER — MIDAZOLAM HCL 2 MG/2ML IJ SOLN
INTRAMUSCULAR | Status: AC
  Filled 2019-03-09: qty 2

## 2019-03-09 MED ORDER — DEXAMETHASONE SODIUM PHOSPHATE 4 MG/ML IJ SOLN
INTRAMUSCULAR | Status: DC | PRN
  Administered 2019-03-09: 8 mg via INTRAVENOUS

## 2019-03-09 MED ORDER — PHENOL 1.4 % MT LIQD
1.0000 | OROMUCOSAL | Status: DC | PRN
  Filled 2019-03-09: qty 177

## 2019-03-09 MED ORDER — MAGNESIUM CITRATE PO SOLN: 1.0000 | Freq: Once | ORAL | Status: DC | PRN

## 2019-03-09 MED ORDER — CEFAZOLIN SODIUM-DEXTROSE 2-4 GM/100ML-% IV SOLN
2.0000 g | INTRAVENOUS | Status: AC
  Administered 2019-03-09: 2 g via INTRAVENOUS
  Filled 2019-03-09: qty 100

## 2019-03-09 MED ORDER — PROPOFOL 500 MG/50ML IV EMUL
INTRAVENOUS | Status: DC | PRN
  Administered 2019-03-09: 50 ug/kg/min via INTRAVENOUS

## 2019-03-09 MED ORDER — CHLORHEXIDINE GLUCONATE 4 % EX LIQD: 60.0000 mL | Freq: Once | CUTANEOUS | Status: DC

## 2019-03-09 MED ORDER — ALBUMIN HUMAN 5 % IV SOLN
INTRAVENOUS | Status: AC
  Filled 2019-03-09: qty 250

## 2019-03-09 MED ORDER — DOCUSATE SODIUM 100 MG PO CAPS
100.0000 mg | ORAL_CAPSULE | Freq: Two times a day (BID) | ORAL | Status: DC
  Administered 2019-03-09 – 2019-03-10 (×2): 100 mg via ORAL
  Filled 2019-03-09 (×2): qty 1

## 2019-03-09 MED ORDER — MEPERIDINE HCL 50 MG/ML IJ SOLN: 6.2500 mg | INTRAMUSCULAR | Status: DC | PRN

## 2019-03-09 MED ORDER — ACETAMINOPHEN 500 MG PO TABS
500.0000 mg | ORAL_TABLET | Freq: Four times a day (QID) | ORAL | Status: DC
  Administered 2019-03-10 (×2): 500 mg via ORAL
  Filled 2019-03-09 (×3): qty 1

## 2019-03-09 MED ORDER — POVIDONE-IODINE 10 % EX SWAB
2.0000 "application " | Freq: Once | CUTANEOUS | Status: AC
  Administered 2019-03-09: 2 via TOPICAL

## 2019-03-09 MED ORDER — ONDANSETRON HCL 4 MG/2ML IJ SOLN
INTRAMUSCULAR | Status: AC
  Filled 2019-03-09: qty 2

## 2019-03-09 MED ORDER — FENTANYL CITRATE (PF) 100 MCG/2ML IJ SOLN
INTRAMUSCULAR | Status: AC
  Filled 2019-03-09: qty 2

## 2019-03-09 MED ORDER — BISACODYL 10 MG RE SUPP: 10.0000 mg | Freq: Every day | RECTAL | Status: DC | PRN

## 2019-03-09 MED ORDER — ALBUMIN HUMAN 5 % IV SOLN
INTRAVENOUS | Status: DC | PRN
  Administered 2019-03-09: 09:00:00 via INTRAVENOUS

## 2019-03-09 MED ORDER — METHOCARBAMOL 500 MG PO TABS
500.0000 mg | ORAL_TABLET | Freq: Four times a day (QID) | ORAL | Status: DC | PRN
  Filled 2019-03-09: qty 1

## 2019-03-09 MED ORDER — SODIUM CHLORIDE 0.9 % IR SOLN
Status: DC | PRN
  Administered 2019-03-09: 1000 mL

## 2019-03-09 MED ORDER — CEFAZOLIN SODIUM-DEXTROSE 2-4 GM/100ML-% IV SOLN
2.0000 g | Freq: Four times a day (QID) | INTRAVENOUS | Status: AC
  Administered 2019-03-09 (×2): 2 g via INTRAVENOUS
  Filled 2019-03-09 (×2): qty 100

## 2019-03-09 MED ORDER — KETOROLAC TROMETHAMINE 30 MG/ML IJ SOLN: 30.0000 mg | Freq: Once | INTRAMUSCULAR | Status: DC | PRN

## 2019-03-09 MED ORDER — ASPIRIN 81 MG PO CHEW
81.0000 mg | CHEWABLE_TABLET | Freq: Two times a day (BID) | ORAL | Status: DC
  Administered 2019-03-09 – 2019-03-10 (×2): 81 mg via ORAL
  Filled 2019-03-09 (×2): qty 1

## 2019-03-09 MED ORDER — HYDROMORPHONE HCL 2 MG PO TABS
2.0000 mg | ORAL_TABLET | ORAL | Status: DC | PRN
  Administered 2019-03-09 – 2019-03-10 (×6): 2 mg via ORAL
  Filled 2019-03-09 (×6): qty 1

## 2019-03-09 MED ORDER — POLYETHYLENE GLYCOL 3350 17 G PO PACK: 17.0000 g | PACK | Freq: Every day | ORAL | Status: DC | PRN

## 2019-03-09 MED ORDER — OXYCODONE HCL 5 MG PO TABS: 5.0000 mg | ORAL_TABLET | Freq: Once | ORAL | Status: DC | PRN

## 2019-03-09 MED ORDER — PROMETHAZINE HCL 25 MG/ML IJ SOLN: 6.2500 mg | INTRAMUSCULAR | Status: DC | PRN

## 2019-03-09 MED ORDER — BUPIVACAINE IN DEXTROSE 0.75-8.25 % IT SOLN
INTRATHECAL | Status: DC | PRN
  Administered 2019-03-09: 15 mg via INTRATHECAL

## 2019-03-09 MED ORDER — FENTANYL CITRATE (PF) 100 MCG/2ML IJ SOLN
INTRAMUSCULAR | Status: DC | PRN
  Administered 2019-03-09: 50 ug via INTRAVENOUS
  Administered 2019-03-09: 25 ug via INTRAVENOUS
  Administered 2019-03-09: 50 ug via INTRAVENOUS

## 2019-03-09 MED ORDER — LIDOCAINE HCL (CARDIAC) PF 100 MG/5ML IV SOSY
PREFILLED_SYRINGE | INTRAVENOUS | Status: DC | PRN
  Administered 2019-03-09: 20 mg via INTRAVENOUS

## 2019-03-09 MED ORDER — ONDANSETRON HCL 4 MG/2ML IJ SOLN
INTRAMUSCULAR | Status: DC | PRN
  Administered 2019-03-09: 4 mg via INTRAVENOUS

## 2019-03-09 MED ORDER — ONDANSETRON HCL 4 MG PO TABS: 4.0000 mg | ORAL_TABLET | Freq: Four times a day (QID) | ORAL | Status: DC | PRN

## 2019-03-09 MED ORDER — EPHEDRINE SULFATE 50 MG/ML IJ SOLN
INTRAMUSCULAR | Status: DC | PRN
  Administered 2019-03-09: 5 mg via INTRAVENOUS
  Administered 2019-03-09: 10 mg via INTRAVENOUS
  Administered 2019-03-09 (×2): 5 mg via INTRAVENOUS

## 2019-03-09 MED ORDER — DEXAMETHASONE SODIUM PHOSPHATE 10 MG/ML IJ SOLN
INTRAMUSCULAR | Status: AC
  Filled 2019-03-09: qty 1

## 2019-03-09 MED ORDER — TRANEXAMIC ACID-NACL 1000-0.7 MG/100ML-% IV SOLN
INTRAVENOUS | Status: DC | PRN
  Administered 2019-03-09: 1000 mg via INTRAVENOUS

## 2019-03-09 MED ORDER — PROPOFOL 500 MG/50ML IV EMUL
INTRAVENOUS | Status: AC
  Filled 2019-03-09: qty 50

## 2019-03-09 MED ORDER — LACTATED RINGERS IV SOLN
INTRAVENOUS | Status: DC
  Administered 2019-03-09 (×3): via INTRAVENOUS

## 2019-03-09 MED ORDER — LIDOCAINE 2% (20 MG/ML) 5 ML SYRINGE
INTRAMUSCULAR | Status: AC
  Filled 2019-03-09: qty 5

## 2019-03-09 MED ORDER — ACETAMINOPHEN 500 MG PO TABS: 1000.0000 mg | ORAL_TABLET | Freq: Once | ORAL | Status: DC

## 2019-03-09 MED ORDER — METOCLOPRAMIDE HCL 5 MG PO TABS: 5.0000 mg | ORAL_TABLET | Freq: Three times a day (TID) | ORAL | Status: DC | PRN

## 2019-03-09 MED ORDER — TRANEXAMIC ACID-NACL 1000-0.7 MG/100ML-% IV SOLN
INTRAVENOUS | Status: AC
  Filled 2019-03-09: qty 100

## 2019-03-09 MED ORDER — HYDROMORPHONE HCL 1 MG/ML IJ SOLN: 0.2500 mg | INTRAMUSCULAR | Status: DC | PRN

## 2019-03-09 MED ORDER — METOCLOPRAMIDE HCL 5 MG/ML IJ SOLN: 5.0000 mg | Freq: Three times a day (TID) | INTRAMUSCULAR | Status: DC | PRN

## 2019-03-09 MED ORDER — METHOCARBAMOL 500 MG IVPB - SIMPLE MED
500.0000 mg | Freq: Four times a day (QID) | INTRAVENOUS | Status: DC | PRN
  Filled 2019-03-09: qty 50

## 2019-03-09 SURGICAL SUPPLY — 54 items
ARTICULEZE HEAD 36 12 (Hips) ×2 IMPLANT
ARTICULEZE HEAD 36MM 12 (Hips) ×1 IMPLANT
BAG SPEC THK2 15X12 ZIP CLS (MISCELLANEOUS) ×1
BAG ZIPLOCK 12X15 (MISCELLANEOUS) ×3 IMPLANT
BLADE SAW SAG 73X25 THK (BLADE) ×1
BLADE SAW SGTL 73X25 THK (BLADE) ×2 IMPLANT
COVER SURGICAL LIGHT HANDLE (MISCELLANEOUS) ×3 IMPLANT
COVER WAND RF STERILE (DRAPES) IMPLANT
DRAPE ORTHO SPLIT 77X108 STRL (DRAPES) ×6
DRAPE POUCH INSTRU U-SHP 10X18 (DRAPES) ×3 IMPLANT
DRAPE SHEET LG 3/4 BI-LAMINATE (DRAPES) ×3 IMPLANT
DRAPE SURG ORHT 6 SPLT 77X108 (DRAPES) ×2 IMPLANT
DRSG MEPILEX BORDER 4X8 (GAUZE/BANDAGES/DRESSINGS) ×3 IMPLANT
DURAPREP 26ML APPLICATOR (WOUND CARE) ×5 IMPLANT
ELECT REM PT RETURN 15FT ADLT (MISCELLANEOUS) ×3 IMPLANT
ELIMINATOR HOLE APEX DEPUY (Hips) ×2 IMPLANT
FACESHIELD WRAPAROUND (MASK) ×12 IMPLANT
FACESHIELD WRAPAROUND OR TEAM (MASK) ×4 IMPLANT
GLOVE BIOGEL PI IND STRL 8 (GLOVE) ×1 IMPLANT
GLOVE BIOGEL PI IND STRL 8.5 (GLOVE) ×1 IMPLANT
GLOVE BIOGEL PI INDICATOR 8 (GLOVE) ×2
GLOVE BIOGEL PI INDICATOR 8.5 (GLOVE) ×2
GLOVE ECLIPSE 8.0 STRL XLNG CF (GLOVE) ×6 IMPLANT
GLOVE ECLIPSE 8.5 STRL (GLOVE) ×6 IMPLANT
GOWN STRL REUS W/TWL 2XL LVL3 (GOWN DISPOSABLE) ×3 IMPLANT
GOWN STRL REUS W/TWL LRG LVL3 (GOWN DISPOSABLE) ×7 IMPLANT
HEAD ARTICULEZE 36 12 (Hips) IMPLANT
HEAD FEMUR METAL 36MM 15.6 HIP (Head) IMPLANT
KIT BASIN OR (CUSTOM PROCEDURE TRAY) ×3 IMPLANT
KIT TURNOVER KIT A (KITS) IMPLANT
LINER NEUTRAL 52X36X52 PLUS 4 (Liner) ×2 IMPLANT
MANIFOLD NEPTUNE II (INSTRUMENTS) ×3 IMPLANT
METAL FEMUR HEAD 36MM 15.6 HIP (Head) ×3 IMPLANT
NDL SAFETY ECLIPSE 18X1.5 (NEEDLE) ×1 IMPLANT
NEEDLE HYPO 18GX1.5 SHARP (NEEDLE) ×3
NS IRRIG 1000ML POUR BTL (IV SOLUTION) ×3 IMPLANT
PACK TOTAL JOINT (CUSTOM PROCEDURE TRAY) ×3 IMPLANT
PIN SECTOR W/GRIP ACE CUP 52MM (Hips) ×2 IMPLANT
PROTECTOR NERVE ULNAR (MISCELLANEOUS) ×3 IMPLANT
STAPLER VISISTAT 35W (STAPLE) ×3 IMPLANT
STEM FEM CMNTLSS LG AML 13.5 (Hips) ×2 IMPLANT
SUT ETHIBOND NAB CT1 #1 30IN (SUTURE) ×6 IMPLANT
SUT MNCRL AB 3-0 PS2 18 (SUTURE) ×3 IMPLANT
SUT VIC AB 0 CT1 27 (SUTURE) ×3
SUT VIC AB 0 CT1 27XBRD ANTBC (SUTURE) ×1 IMPLANT
SUT VIC AB 1 CTX 36 (SUTURE) ×6
SUT VIC AB 1 CTX36XBRD ANBCTR (SUTURE) ×2 IMPLANT
SUT VIC AB 2-0 CT1 27 (SUTURE) ×6
SUT VIC AB 2-0 CT1 TAPERPNT 27 (SUTURE) ×2 IMPLANT
SYR CONTROL 10ML LL (SYRINGE) ×3 IMPLANT
TOWEL OR 17X26 10 PK STRL BLUE (TOWEL DISPOSABLE) ×3 IMPLANT
TOWEL OR NON WOVEN STRL DISP B (DISPOSABLE) ×3 IMPLANT
TRAY FOLEY MTR SLVR 16FR STAT (SET/KITS/TRAYS/PACK) ×3 IMPLANT
WATER STERILE IRR 1000ML POUR (IV SOLUTION) ×6 IMPLANT

## 2019-03-09 NOTE — Anesthesia Procedure Notes (Signed)
Spinal  Start time: 03/09/2019 7:27 AM End time: 03/09/2019 7:32 AM Staffing Resident/CRNA: Garrel Ridgel, CRNA Performed: resident/CRNA  Preanesthetic Checklist Completed: patient identified, site marked, surgical consent, pre-op evaluation, timeout performed, IV checked, risks and benefits discussed and monitors and equipment checked Spinal Block Patient position: sitting Prep: ChloraPrep and site prepped and draped Patient monitoring: heart rate, continuous pulse ox, blood pressure and cardiac monitor Approach: midline Location: L3-4 Injection technique: single-shot Needle Needle type: Pencan  Needle gauge: 24 G Needle insertion depth: 4 cm Assessment Sensory level: T8

## 2019-03-09 NOTE — Evaluation (Signed)
Physical Therapy Evaluation Patient Details Name: Garrett Brown. MRN: [id number] DOB: 12-Jan-1951 Today's Date: 03/09/2019   History of Present Illness  R posterior THA; PMH of L posterior THA 2016  Clinical Impression  Pt is s/p THA resulting in the deficits listed below (see PT Problem List). Pt ambulated 25' with RW, initiated THA HEP, instructed pt in posterior precautions. Good progress expected.  Pt will benefit from skilled PT to increase their independence and safety with mobility to allow discharge to the venue listed below.      Follow Up Recommendations Follow surgeon's recommendation for DC plan and follow-up therapies    Equipment Recommendations  3in1 (PT)    Recommendations for Other Services       Precautions / Restrictions Precautions Precautions: Posterior Hip Precaution Booklet Issued: Yes (comment) Restrictions Weight Bearing Restrictions: No Other Position/Activity Restrictions: WBAT      Mobility  Bed Mobility Overal bed mobility: Modified Independent             General bed mobility comments: HOB up  Transfers Overall transfer level: Needs assistance Equipment used: Rolling walker (2 wheeled) Transfers: Sit to/from Stand Sit to Stand: From elevated surface;Min assist         General transfer comment: VCs for hand placement, min A to power up  Ambulation/Gait Ambulation/Gait assistance: Min guard Gait Distance (Feet): 60 Feet Assistive device: Rolling walker (2 wheeled) Gait Pattern/deviations: Step-to pattern;Decreased stride length Gait velocity: decr   General Gait Details: VCs sequencing, no loss of balance  Stairs            Wheelchair Mobility    Modified Rankin (Stroke Patients Only)       Balance Overall balance assessment: Modified Independent                                           Pertinent Vitals/Pain Pain Assessment: 0-10 Pain Score: 5  Pain Location: R hip Pain Descriptors /  Indicators: Sore Pain Intervention(s): Limited activity within patient's tolerance;Monitored during session;Premedicated before session;Ice applied    Home Living Family/patient expects to be discharged to:: Private residence Living Arrangements: Alone Available Help at Discharge: Friend(s);Family;Available PRN/intermittently   Home Access: Stairs to enter   Entrance Stairs-Number of Steps: 2 Home Layout: One level Home Equipment: Walker - 2 wheels;Cane - single point      Prior Function Level of Independence: Independent with assistive device(s)         Comments: walked with SPC     Hand Dominance        Extremity/Trunk Assessment   Upper Extremity Assessment Upper Extremity Assessment: Overall WFL for tasks assessed    Lower Extremity Assessment Lower Extremity Assessment: RLE deficits/detail RLE Deficits / Details: R hip AAROM flexion ~45*, ABDuction 15* RLE Sensation: decreased light touch(decreased lateral hip R lateral hip)    Cervical / Trunk Assessment Cervical / Trunk Assessment: Normal  Communication      Cognition Arousal/Alertness: Awake/alert Behavior During Therapy: WFL for tasks assessed/performed Overall Cognitive Status: Within Functional Limits for tasks assessed                                        General Comments      Exercises Total Joint Exercises Ankle Circles/Pumps: AROM;Both;10 reps;Supine Hip ABduction/ADduction: AAROM;Right;10 reps;Supine  Assessment/Plan    PT Assessment Patient needs continued PT services  PT Problem List Decreased strength;Decreased range of motion;Decreased activity tolerance;Pain;Decreased knowledge of precautions;Decreased mobility       PT Treatment Interventions DME instruction;Gait training;Stair training;Functional mobility training;Therapeutic exercise;Therapeutic activities;Patient/family education    PT Goals (Current goals can be found in the Care Plan section)  Acute  Rehab PT Goals Patient Stated Goal: walk farther PT Goal Formulation: With patient Time For Goal Achievement: 03/15/19 Potential to Achieve Goals: Good    Frequency 7X/week   Barriers to discharge        Co-evaluation               AM-PAC PT "6 Clicks" Mobility  Outcome Measure Help needed turning from your back to your side while in a flat bed without using bedrails?: A Little Help needed moving from lying on your back to sitting on the side of a flat bed without using bedrails?: A Little Help needed moving to and from a bed to a chair (including a wheelchair)?: A Little Help needed standing up from a chair using your arms (e.g., wheelchair or bedside chair)?: A Little   Help needed climbing 3-5 steps with a railing? : A Lot 6 Click Score: 14    End of Session Equipment Utilized During Treatment: Gait belt Activity Tolerance: Patient tolerated treatment well Patient left: in chair;with call bell/phone within reach Nurse Communication: Mobility status PT Visit Diagnosis: Pain;Difficulty in walking, not elsewhere classified (R26.2) Pain - Right/Left: Right Pain - part of body: Hip    Time: 1450-1509 PT Time Calculation (min) (ACUTE ONLY): 19 min   Charges:   PT Evaluation $PT Eval Low Complexity: 1 Low          Tamala Ser PT 03/09/2019  Acute Rehabilitation Services Pager [phone] Office [phone]

## 2019-03-09 NOTE — Anesthesia Preprocedure Evaluation (Addendum)
Anesthesia Evaluation  Patient identified by MRN, date of birth, ID band Patient awake    Reviewed: Allergy & Precautions, NPO status , Patient's Chart, lab work & pertinent test results  Airway Mallampati: II  TM Distance: >3 FB Neck ROM: Full    Dental  (+) Edentulous Upper, Partial Lower, Dental Advisory Given   Pulmonary Current Smoker and Patient abstained from smoking.,    Pulmonary exam normal  (-) decreased breath sounds      Cardiovascular negative cardio ROS   Rhythm:Regular Rate:Normal     Neuro/Psych negative neurological ROS  negative psych ROS   GI/Hepatic negative GI ROS, (+)     substance abuse  marijuana use,   Endo/Other  negative endocrine ROS  Renal/GU negative Renal ROS  negative genitourinary   Musculoskeletal  (+) Arthritis , Osteoarthritis,    Abdominal   Peds negative pediatric ROS (+)  Hematology negative hematology ROS (+)   Anesthesia Other Findings   Reproductive/Obstetrics negative OB ROS                           Anesthesia Physical  Anesthesia Plan  ASA: II  Anesthesia Plan: MAC and Spinal   Post-op Pain Management:    Induction:   PONV Risk Score and Plan: Propofol infusion and TIVA  Airway Management Planned: Natural Airway and Simple Face Mask  Additional Equipment: None  Intra-op Plan:   Post-operative Plan:   Informed Consent: I have reviewed the patients History and Physical, chart, labs and discussed the procedure including the risks, benefits and alternatives for the proposed anesthesia with the patient or authorized representative who has indicated his/her understanding and acceptance.       Plan Discussed with: CRNA  Anesthesia Plan Comments:        Anesthesia Quick Evaluation

## 2019-03-09 NOTE — H&P (Signed)
The recent History & Physical has been reviewed. I have personally examined the patient today. There is no interval change to the documented History & Physical. The patient would like to proceed with the procedure.  Garald Balding 03/09/2019,  7:09 AM

## 2019-03-09 NOTE — Anesthesia Postprocedure Evaluation (Signed)
Anesthesia Post Note  Patient: Garrett Brown.  Procedure(s) Performed: RIGHT TOTAL HIP ARTHROPLASTY (Right Hip)     Patient location during evaluation: PACU Anesthesia Type: MAC and Spinal Level of consciousness: oriented and awake and alert Pain management: pain level controlled Vital Signs Assessment: post-procedure vital signs reviewed and stable Respiratory status: spontaneous breathing, respiratory function stable and patient connected to nasal cannula oxygen Cardiovascular status: blood pressure returned to baseline and stable Postop Assessment: no headache, no backache and no apparent nausea or vomiting Anesthetic complications: no    Last Vitals:  Vitals:   03/09/19 1030 03/09/19 1045  BP: 125/71 112/69  Pulse: 69 67  Resp: 17 13  Temp:    SpO2: 100% 100%    Last Pain:  Vitals:   03/09/19 1045  TempSrc:   PainSc: 0-No pain    LLE Motor Response: Purposeful movement (03/09/19 1045) LLE Sensation: Decreased (03/09/19 1045) RLE Motor Response: Purposeful movement (03/09/19 1045) RLE Sensation: Decreased (03/09/19 1045) L Sensory Level: S1-Sole of foot, small toes (03/09/19 1045) R Sensory Level: S1-Sole of foot, small toes (03/09/19 1045)  Pervis Hocking

## 2019-03-09 NOTE — Transfer of Care (Signed)
Immediate Anesthesia Transfer of Care Note  Patient: Garrett Brown.  Procedure(s) Performed: RIGHT TOTAL HIP ARTHROPLASTY (Right Hip)  Patient Location: PACU  Anesthesia Type:Spinal  Level of Consciousness: awake, alert , oriented and patient cooperative  Airway & Oxygen Therapy: Patient Spontanous Breathing and Patient connected to face mask oxygen  Post-op Assessment: Report given to RN and Post -op Vital signs reviewed and stable  Post vital signs: Reviewed and stable  Last Vitals:  Vitals Value Taken Time  BP 120/77 03/09/19 1012  Temp    Pulse 66 03/09/19 1014  Resp 23 03/09/19 1014  SpO2 100 % 03/09/19 1014  Vitals shown include unvalidated device data.  Last Pain:  Vitals:   03/09/19 0615  TempSrc: Oral  PainSc:       Patients Stated Pain Goal: 3 (63/81/77 1165)  Complications: No apparent anesthesia complications

## 2019-03-09 NOTE — Op Note (Signed)
PATIENT ID:      Garrett Brown.  MRN:     [id number] DOB/AGE:    66-23-1952 / 66 y.o.       OPERATIVE REPORT    DATE OF PROCEDURE:  03/09/2019       PREOPERATIVE DIAGNOSIS: end stage  right hip osteoarthritis                                                       Estimated body mass index is 27.9 kg/m as calculated from the following:   Height as of this encounter: 5' 10.5" (1.791 m).   Weight as of this encounter: 89.4 kg.     POSTOPERATIVE DIAGNOSIS: end stage  right hip osteoarthritis                                                                      Estimated body mass index is 27.9 kg/m as calculated from the following:   Height as of this encounter: 5' 10.5" (1.791 m).   Weight as of this encounter: 89.4 kg.     PROCEDURE:  Procedure(s): RIGHT TOTAL HIP ARTHROPLASTY      SURGEON:  Joni Fears, MD    ASSISTANT:   Biagio Borg, PA-C   (Present and scrubbed throughout the case, critical for assistance with exposure, retraction, instrumentation, and closure.)          ANESTHESIA: spinal and IV sedation     DRAINS: none :      TOURNIQUET TIME: none   COMPLICATIONS:  None   CONDITION:  stable  PROCEDURE IN DETAIL: [place] 03/09/2019, 9:48 AM

## 2019-03-09 NOTE — Op Note (Signed)
NAME: Garrett Brown, Garrett Brown MEDICAL RECORD [id number] ACCOUNT [id number] DATE OF BIRTH:[birth year] FACILITY: [facility] LOCATION: [facility] PHYSICIAN:PETER Sharlotte Alamo, MD  OPERATIVE REPORT  DATE OF PROCEDURE:  03/09/2019  PREOPERATIVE DIAGNOSIS:  End-stage osteoarthritis, right hip.  POSTOPERATIVE DIAGNOSIS:  End-stage osteoarthritis, right hip.  PROCEDURE:  Right total hip replacement.  SURGEON:  Joni Fears, MD  ASSISTANT:  Biagio Borg, PA-C  ANESTHESIA:  Spinal with supplemental IV sedation.  COMPLICATIONS:  None.  COMPONENTS:  A 13.5 large stature AML femoral stem, 36 mm outer diameter hip ball with a 15.5 mm neck length.  Gription Pinnacle acetabular shell, 52 mm outer diameter with a Marathon polyethylene liner +4 with a 10-degree posterior lip and an apex hole  eliminator.  Components were press-fit.  DESCRIPTION OF PROCEDURE:  The patient was met in the holding area, identified the right hip as appropriate operative site and marked it accordingly.  Any questions were answered.  The patient was then transported to room #4.  Spinal anesthesia was  performed by anesthesia without difficulty.  The patient was then placed in the supine position and nursing staff inserted a Foley catheter.  Urine was clear.  Under IV sedation, the patient was then placed in the lateral decubitus position with the right side up and secured to the operating table with the Innomed hip system.  The right hip was then prepped from iliac crest to the ankle with chlorhexidine scrub and DuraPrep x2.  Sterile draping was performed.  A timeout was called.  A routine Southern incision was utilized and via sharp dissection carried out to subcutaneous tissue.  Gross bleeders were Bovie coagulated.  Muscle fibers were then identified and bluntly separated.  Self-retaining retractor was inserted.  With the hip  internally rotated, the short external rotators were identified and incised with the Bovie.   Tendinous structures were tagged with 0 Ethibond suture.  Hip capsule was identified and incised along the length of the skin incision.  The joint was then entered.  There was a 5 or so mL effusion.  There was obvious beefy red synovitis extruding from the joint.  The hip was then easily dislocated posteriorly.  Using the calcar guide, the head was osteotomized about a fingerbreadth proximal to the lesser trochanter.  The head was inspected and the large majority was devoid of articular cartilage.  The proximal femur was then delivered into the wound.  Retractors were placed about the proximal femur.  Apex hole was identified and a reamer inserted.  I checked to be sure we were within the canal with a canal finder.  Reaming was performed to 13 mm  to accept a 13.5 mm component.  I sequentially rasped from 10 to 12 to 13.5 femur.  I then inserted the 13.5 large stature component, which fit nicely and was flush on the calcar.  Retractors then placed about the acetabulum.  Synovitis was removed.  The large thickened labrum was also excised.  There were osteophytes about the inferior aspect of the acetabulum, which were removed.  Reaming was performed to 51 mm to accept a 52 component.  The acetabular wall was quite thick.  I did ream so that we could insert the acetabulum with complete coverage.  I then trialed the 50, which had good rim fit, but would not completely seat.  The  52 was then inserted and had good rim fit, but would not completely seat.  Accordingly, we then impacted the final Gription 3 acetabular component with excellent capture.  The  wound was irrigated with saline solution.  I applied the trial polyethylene  component.  The trial femur was applied followed by the trial neck.  We trialed several neck lengths and felt like we were most stable with a 13 mm neck.  We used the long neck because we had seated the acetabulum deeper.  At that point, the wound was irrigated with saline  solution, removed the trial components.  The apex hole eliminator was inserted followed by the Marathon polyethylene liner +4 with a 10-degree posterior lip.  We then impacted the 13.5 large stature femoral component flush on the calcar.  We then inserted the 13 mm hip ball, 36 mm outer diameter and then reduced the joint.  We felt that the hip was unstable and it did toggle so then we used the final 15.5 mm neck length, 36 mm outer diameter hip ball and reduced this.  There was minimal  toggling and I thought this was perfectly stable at every range of motion.  The wound was again irrigated with saline solution.  The capsule was closed anatomically with #1 Ethibond.  The wound was again irrigated.  Short external rotators were closed with a similar material.  IT band was closed with a running #1 Vicryl.   Subcutaneous was closed in several layers with Vicryl and 3-0 Monocryl, skin closed with skin clips.  Sterile bulky dressing was applied.  We thought the leg lengths were symmetrical.  I did palpate the sciatic nerve throughout the procedure and felt that it was well out of harm's way.  The patient was given 2 grams of IV Ancef and tranexamic acid intravenously.  The patient tolerated the procedure without problems.  CN/NUANCE  D:03/09/2019 T:03/09/2019 JOB:[id number]

## 2019-03-10 DIAGNOSIS — M1611 Unilateral primary osteoarthritis, right hip: Secondary | ICD-10-CM | POA: Diagnosis not present

## 2019-03-10 LAB — CBC
HCT: 35.7 % — ABNORMAL LOW (ref 39.0–52.0)
Hemoglobin: 11.6 g/dL — ABNORMAL LOW (ref 13.0–17.0)
MCH: 30.1 pg (ref 26.0–34.0)
MCHC: 32.5 g/dL (ref 30.0–36.0)
MCV: 92.5 fL (ref 80.0–100.0)
Platelets: 232 10*3/uL (ref 150–400)
RBC: 3.86 MIL/uL — ABNORMAL LOW (ref 4.22–5.81)
RDW: 14.3 % (ref 11.5–15.5)
WBC: 16.4 10*3/uL — ABNORMAL HIGH (ref 4.0–10.5)
nRBC: 0 % (ref 0.0–0.2)

## 2019-03-10 LAB — BASIC METABOLIC PANEL
Anion gap: 8 (ref 5–15)
BUN: 15 mg/dL (ref 8–23)
CO2: 24 mmol/L (ref 22–32)
Calcium: 8.3 mg/dL — ABNORMAL LOW (ref 8.9–10.3)
Chloride: 104 mmol/L (ref 98–111)
Creatinine, Ser: 0.86 mg/dL (ref 0.61–1.24)
GFR calc Af Amer: >60 mL/min (ref >60)
GFR calc non Af Amer: >60 mL/min (ref >60)
Glucose, Bld: 135 mg/dL — ABNORMAL HIGH (ref 70–99)
Potassium: 3.8 mmol/L (ref 3.5–5.1)
Sodium: 136 mmol/L (ref 135–145)

## 2019-03-10 MED ORDER — ASPIRIN 81 MG PO CHEW: 81.0000 mg | CHEWABLE_TABLET | Freq: Two times a day (BID) | ORAL | Status: DC

## 2019-03-10 MED ORDER — ACETAMINOPHEN 325 MG PO TABS: 650.0000 mg | ORAL_TABLET | Freq: Four times a day (QID) | ORAL | 0 refills | Status: AC | PRN

## 2019-03-10 MED ORDER — METHOCARBAMOL 500 MG PO TABS: 500.0000 mg | ORAL_TABLET | Freq: Three times a day (TID) | ORAL | 0 refills | Status: DC | PRN

## 2019-03-10 MED ORDER — HYDROMORPHONE HCL 2 MG PO TABS: 2.0000 mg | ORAL_TABLET | ORAL | 0 refills | Status: DC | PRN

## 2019-03-10 NOTE — Discharge Summary (Signed)
Norlene Campbell, MD   Jacqualine Code, PA-C [place], [place]  [address]                             [phone]  PATIENT ID: Garrett Brown.        MRN:  [id number]          DOB/AGE: [birth year] / 68 y.o.    DISCHARGE SUMMARY  ADMISSION DATE:    03/09/2019 DISCHARGE DATE:   03/10/2019   ADMISSION DIAGNOSIS: right hip osteoarthritis    DISCHARGE DIAGNOSIS:  right hip osteoarthritis    ADDITIONAL DIAGNOSIS: Active Problems:   Osteoarthritis of right hip  Past Medical History:  Diagnosis Date  . Arthritis   . Constipation due to pain medication     PROCEDURE: Procedure(s): RIGHT TOTAL HIP ARTHROPLASTY  on 03/09/2019  CONSULTS: none    HISTORY: 68 y.o. male, has a history of pain and functional disability in the right hip(s) due to arthritis and patient has failed non-surgical conservative treatments for greater than 12 weeks to include NSAID's and/or analgesics, corticosteriod injections, flexibility and strengthening excercises, use of assistive devices and activity modification.  Onset of symptoms was gradual starting 5 years ago with rapidlly worsening course since that time.The patient noted no past surgery on the right hip(s).  Patient currently rates pain in the right hip at 8 out of 10 with activity. Patient has night pain, worsening of pain with activity and weight bearing, pain that interfers with activities of daily living and pain with passive range of motion. Patient has evidence of subchondral cysts, subchondral sclerosis, periarticular osteophytes and joint space narrowing by imaging studies. This condition presents safety issues increasing the risk of falls   HOSPITAL COURSE:  Deondrae Mcgrail. is a 68 y.o. admitted on 03/09/2019 and found to have a diagnosis of right hip osteoarthritis.  After appropriate laboratory studies were obtained  they were taken to the operating room on 03/09/2019 and underwent  Procedure(s): RIGHT TOTAL HIP ARTHROPLASTY   .   They  were given perioperative antibiotics:  Anti-infectives (From admission, onward)   Start     Dose/Rate Route Frequency Ordered Stop   03/09/19 1400  ceFAZolin (ANCEF) IVPB 2g/100 mL premix     2 g 200 mL/hr over 30 Minutes Intravenous Every 6 hours 03/09/19 1203 03/09/19 2057   03/09/19 0600  ceFAZolin (ANCEF) IVPB 2g/100 mL premix     2 g 200 mL/hr over 30 Minutes Intravenous On call to O.R. 03/09/19 0626 03/09/19 0732    .  Tolerated the procedure well.  Placed with a foley intraoperatively.  Toradol was given post op. 50% weight bearing for questionable non displaced fracture inferior tip of femoral stem.  POD #1, allowed out of bed to a chair.  PT for ambulation and exercise program.  Foley D/C'd in morning.  IV saline locked.  O2 discontionued.  The remainder of the hospital course was dedicated to ambulation and strengthening.   The patient was discharged on 1 Day Post-Op in  Stable condition.  Blood products given:none  DIAGNOSTIC STUDIES: Recent vital signs:  Patient Vitals for the past 24 hrs:  BP Temp Temp src Pulse Resp SpO2  03/10/19 0929 123/79 97.7 F (36.5 C) Oral 75 20 95 %  03/10/19 0457 132/71 98.3 F (36.8 C) Oral 72 16 97 %  03/10/19 0135 130/85 98.6 F (37 C) Oral 78 16 98 %  03/09/19 2047 124/82 98.2  F (36.8 C) Oral 88 18 98 %  03/09/19 1839 115/73 97.9 F (36.6 C) Oral 80 16 99 %  03/09/19 1452 135/80 97.6 F (36.4 C) Oral 79 18 100 %  03/09/19 1404 137/84 97.8 F (36.6 C) Oral 82 18 100 %  03/09/19 1311 125/83 97.6 F (36.4 C) Oral 83 16 100 %       Recent laboratory studies: Recent Labs    03/05/19 1450 03/10/19 0511  WBC 10.6* 16.4*  HGB 14.2 11.6*  HCT 43.9 35.7*  PLT 292 232   Recent Labs    03/05/19 1450 03/10/19 0511  NA 138 136  K 3.7 3.8  CL 105 104  CO2 24 24  BUN 20 15  CREATININE 0.89 0.86  GLUCOSE 111* 135*  CALCIUM 8.8* 8.3*   Lab Results  Component Value Date   INR 1.0 03/05/2019   INR 1.12 02/10/2015      Recent Radiographic Studies :  Dg Chest 2 View  Result Date: 03/05/2019 CLINICAL DATA:  68 year old male under preoperative evaluation prior to hip surgery next week. EXAM: CHEST - 2 VIEW COMPARISON:  Chest x-ray 02/10/2015. FINDINGS: Lung volumes are normal. No consolidative airspace disease. No pleural effusions. No pneumothorax. No pulmonary nodule or mass noted. Pulmonary vasculature and the cardiomediastinal silhouette are within normal limits. IMPRESSION: No radiographic evidence of acute cardiopulmonary disease. Electronically Signed   By: Trudie Reed M.D.   On: 03/05/2019 16:29   Dg Hip Port Unilat With Pelvis 1v Right  Result Date: 03/09/2019 CLINICAL DATA:  Status post right hip replacement. EXAM: DG HIP (WITH OR WITHOUT PELVIS) 1V PORT RIGHT COMPARISON:  December 16, 2018. FINDINGS: Right acetabular and femoral components appear to be well situated. Expected postoperative changes are seen in the surrounding soft tissues. Linear lucency is seen just distal to the distal portion of the femoral prosthesis within the mid femoral shaft which may represent fracture. IMPRESSION: Status post right total hip arthroplasty. Longitudinal lucency is seen within the intramedullary portion of the midshaft of the right femur just distal to femoral prosthesis concerning for nondisplaced fracture. Electronically Signed   By: Lupita Raider M.D.   On: 03/09/2019 11:29    DISCHARGE INSTRUCTIONS: Discharge Instructions    Call MD / Call 911   Complete by: As directed    If you experience chest pain or shortness of breath, CALL 911 and be transported to the hospital emergency room.  If you develope a fever above 101 F, pus (white drainage) or increased drainage or redness at the wound, or calf pain, call your surgeon's office.   Change dressing   Complete by: As directed    DO NOT REMOVE OR CHANGE DRESSING   Constipation Prevention   Complete by: As directed    Drink plenty of fluids.  Prune juice  may be helpful.  You may use a stool softener, such as Colace (over the counter) 100 mg twice a day.  Use MiraLax (over the counter) for constipation as needed.   Diet Brown   Complete by: As directed    Discharge instructions   Complete by: As directed    INSTRUCTIONS AFTER JOINT REPLACEMENT   Remove items at home which could result in a fall. This includes throw rugs or furniture in walking pathways ICE to the affected joint every three hours while awake for 30 minutes at a time, for at least the first 3-5 days, and then as needed for pain and swelling.  Continue to  use ice for pain and swelling. You may notice swelling that will progress down to the foot and ankle.  This is normal after surgery.  Elevate your leg when you are not up walking on it.   Continue to use the breathing machine you got in the hospital (incentive spirometer) which will help keep your temperature down.  It is common for your temperature to cycle up and down following surgery, especially at night when you are not up moving around and exerting yourself.  The breathing machine keeps your lungs expanded and your temperature down.   DIET:  As you were doing prior to hospitalization, we recommend a well-balanced diet.  DRESSING / WOUND CARE / SHOWERING  Keep the surgical dressing until follow up.  The dressing is water proof, so you can shower without any extra covering.  IF THE DRESSING FALLS OFF or the wound gets wet inside, change the dressing with sterile gauze.  Please use good hand washing techniques before changing the dressing.  Do not use any lotions or creams on the incision until instructed by your surgeon.    ACTIVITY  Increase activity slowly as tolerated, but follow the weight bearing instructions below.   No driving for 6 weeks or until further direction given by your physician.  You cannot drive while taking narcotics.  No lifting or carrying greater than 10 lbs. until further directed by your  surgeon. Avoid periods of inactivity such as sitting longer than an hour when not asleep. This helps prevent blood clots.  You may return to work once you are authorized by your doctor.     WEIGHT BEARING   Weight bearing as tolerated with assist device (walker, cane, etc) as directed, use it as long as suggested by your surgeon or therapist, typically at least 4-6 weeks.   EXERCISES  Results after joint replacement surgery are often greatly improved when you follow the exercise, range of motion and muscle strengthening exercises prescribed by your doctor. Safety measures are also important to protect the joint from further injury. Any time any of these exercises cause you to have increased pain or swelling, decrease what you are doing until you are comfortable again and then slowly increase them. If you have problems or questions, call your caregiver or physical therapist for advice.   Rehabilitation is important following a joint replacement. After just a few days of immobilization, the muscles of the leg can become weakened and shrink (atrophy).  These exercises are designed to build up the tone and strength of the thigh and leg muscles and to improve motion. Often times heat used for twenty to thirty minutes before working out will loosen up your tissues and help with improving the range of motion but do not use heat for the first two weeks following surgery (sometimes heat can increase post-operative swelling).   These exercises can be done on a training (exercise) mat, on the floor, on a table or on a bed. Use whatever works the best and is most comfortable for you.    Use music or television while you are exercising so that the exercises are a pleasant break in your day. This will make your life better with the exercises acting as a break in your routine that you can look forward to.   Perform all exercises about fifteen times, three times per day or as directed.  You should exercise both the  operative leg and the other leg as well.   Exercises include:  [employer]  Sets - Tighten up the muscle on the front of the thigh (Quad) and hold for 5-10 seconds.   Straight Leg Raises - With your knee straight (if you were given a brace, keep it on), lift the leg to 60 degrees, hold for 3 seconds, and slowly lower the leg.  Perform this exercise against resistance later as your leg gets stronger.  Leg Slides: Lying on your back, slowly slide your foot toward your buttocks, bending your knee up off the floor (only go as far as is comfortable). Then slowly slide your foot back down until your leg is flat on the floor again.  Angel Wings: Lying on your back spread your legs to the side as far apart as you can without causing discomfort.  Hamstring Strength:  Lying on your back, push your heel against the floor with your leg straight by tightening up the muscles of your buttocks.  Repeat, but this time bend your knee to a comfortable angle, and push your heel against the floor.  You may put a pillow under the heel to make it more comfortable if necessary.   A rehabilitation program following joint replacement surgery can speed recovery and prevent re-injury in the future due to weakened muscles. Contact your doctor or a physical therapist for more information on knee rehabilitation.    CONSTIPATION  Constipation is defined medically as fewer than three stools per week and severe constipation as less than one stool per week.  Even if you have a regular bowel pattern at home, your normal regimen is likely to be disrupted due to multiple reasons following surgery.  Combination of anesthesia, postoperative narcotics, change in appetite and fluid intake all can affect your bowels.   YOU MUST use at least one of the following options; they are listed in order of increasing strength to get the job done.  They are all available over the counter, and you may need to use some, POSSIBLY even all of these options:     Drink plenty of fluids (prune juice may be helpful) and high fiber foods Colace 100 mg by mouth twice a day  Senokot for constipation as directed and as needed Dulcolax (bisacodyl), take with full glass of water  Miralax (polyethylene glycol) once or twice a day as needed.  If you have tried all these things and are unable to have a bowel movement in the first 3-4 days after surgery call either your surgeon or your primary doctor.    If you experience loose stools or diarrhea, hold the medications until you stool forms back up.  If your symptoms do not get better within 1 week or if they get worse, check with your doctor.  If you experience "the worst abdominal pain ever" or develop nausea or vomiting, please contact the office immediately for further recommendations for treatment.   ITCHING:  If you experience itching with your medications, try taking only a single pain pill, or even half a pain pill at a time.  You can also use Benadryl over the counter for itching or also to help with sleep.   TED HOSE STOCKINGS:  Use stockings on both legs until for at least 2 weeks or as directed by physician office. They may be removed at night for sleeping.  MEDICATIONS:  See your medication summary on the "After Visit Summary" that nursing will review with you.  You may have some home medications which will be placed on hold until you complete the course of blood  thinner medication.  It is important for you to complete the blood thinner medication as prescribed.  PRECAUTIONS:  If you experience chest pain or shortness of breath - call 911 immediately for transfer to the hospital emergency department.   If you develop a fever greater that 101 F, purulent drainage from wound, increased redness or drainage from wound, foul odor from the wound/dressing, or calf pain - CONTACT YOUR SURGEON.                                                   FOLLOW-UP APPOINTMENTS:  If you do not already have a post-op  appointment, please call the office for an appointment to be seen by your surgeon.  Guidelines for how soon to be seen are listed in your "After Visit Summary", but are typically between 1-4 weeks after surgery.  OTHER INSTRUCTIONS:   Knee Replacement:  Do not place pillow under knee, focus on keeping the knee straight while resting. CPM instructions: 0-90 degrees, 2 hours in the morning, 2 hours in the afternoon, and 2 hours in the evening. Place foam block, curve side up under heel at all times except when in CPM or when walking.  DO NOT modify, tear, cut, or change the foam block in any way.  MAKE SURE YOU:  Understand these instructions.  Get help right away if you are not doing well or get worse.    Thank you for letting us be a part of your medical care team.  It is a privilege we respect greatly.  We hope these instructions will help you stay on track for a fast and full recovery!   Driving restrictions   Complete by: As directed    No driving for 6 weeks   Follow the hip precautions as taught in Physical Therapy   Complete by: As directed    Increase activity slowly as tolerated   Complete by: As directed    Lifting restrictions   Complete by: As directed    No lifting for 6 weeks   Partial weight bearing   Complete by: As directed    % Body Weight: 50   Laterality: right   Extremity: Lower   Patient may shower   Complete by: As directed    You may shower over the brown dressing.  DO NOT REMOVE the brown dressing   TED hose   Complete by: As directed    Use stockings (TED hose) for 4 weeks on RIGHT leg(s).  You may remove them at night for sleeping.      DISCHARGE MEDICATIONS:   Allergies as of 03/10/2019   No Known Allergies     Medication List    STOP taking these medications   HYDROcodone-acetaminophen 5-325 MG tablet Commonly known as: NORCO/VICODIN   naproxen sodium 220 MG tablet Commonly known as: ALEVE     TAKE these medications   acetaminophen 325  MG tablet Commonly known as: Tylenol Take 2 tablets (650 mg total) by mouth every 6 (six) hours as needed (May use every 6 hours around the clock for pain control.  Then use you Dilaudid (hydromorphone) in addition as  needed).   aspirin 81 MG chewable tablet Chew 1 tablet (81 mg total) by mouth 2 (two) times daily.   HYDROmorphone 2 MG tablet Commonly known as: DILAUDID Take 1 tablet (  2 mg total) by mouth every 4 (four) hours as needed for moderate pain or severe pain.   methocarbamol 500 MG tablet Commonly known as: ROBAXIN Take 1 tablet (500 mg total) by mouth every 8 (eight) hours as needed for muscle spasms.            Durable Medical Equipment  (From admission, onward)         Start     Ordered   03/09/19 1204  DME 3 n 1  Once     03/09/19 1203   03/09/19 1204  DME Bedside commode  Once    Question:  Patient needs a bedside commode to treat with the following condition  Answer:  Status post total replacement of right hip   03/09/19 1203   03/09/19 1204  DME Walker rolling  Once    Question:  Patient needs a walker to treat with the following condition  Answer:  Pain due to total hip replacement (HCC)   03/09/19 1203           Discharge Care Instructions  (From admission, onward)         Start     Ordered   03/10/19 0000  Change dressing    Comments: DO NOT REMOVE OR CHANGE DRESSING   03/10/19 1153   03/10/19 0000  Partial weight bearing    Question Answer Comment  % Body Weight 50   Laterality right   Extremity Lower      03/10/19 1153          FOLLOW UP VISIT:   Follow-up Information    [facility] up.   Specialty: Home Health Services Contact information: [address] [place] [address] [phone]        Valeria BatmanWhitfield, Peter W, MD.   Specialty: Orthopedic Surgery Contact information: [address] [place] [address] [phone]        Valeria BatmanWhitfield, Peter W, MD. Go on 03/23/2019.   Specialty:  Orthopedic Surgery Why: The New Office (on [address]) is next to the [address] [facility] Contact information: [address] [place] [address] [phone]           DISPOSITION:   Home  CONDITION:  Stable   Oris DroneBrian D. Aleda Granaetrarca, PA-C [facility] [phone]  03/10/2019 12:06 PM

## 2019-03-10 NOTE — Plan of Care (Signed)
Patient discharged home in stable condition 

## 2019-03-10 NOTE — TOC Transition Note (Signed)
Transition of Care [facility]) - CM/SW Discharge Note   Patient Details  Name: Garrett Brown. MRN: [id number] Date of Birth: 01/01/1951  [facility] CM/SW Contact:  Lia Hopping, LCSW Phone Number: 03/10/2019, 9:37 AM   Clinical Narrative:    CSW met with the patient at bedside to discuss HHPT. CSW provided a list of Home Health agencies from [email or web address]. Patient reviewed and chose [facility]. CSW reached out to Rep. Ronalee Belts. Services confirmed. 3 in 1 ordered through [place].    Final next level of care: [place] Barriers to Discharge: No Barriers Identified   Patient Goals and CMS Choice Patient states their goals for this hospitalization and ongoing recovery are:: to improve walking ability with physical therapy CMS Medicare.gov Compare Post Acute Care list provided to:: Patient Choice offered to / list presented to : Patient  Discharge Placement                       Discharge Plan and Services                DME Arranged: 3-N-1 DME Agency: [employer] Date DME Agency Contacted: 03/10/19   Representative spoke with at DME Agency: Ovid Curd HH Arranged: PT [place]: [facility] (formerly [employer]) Date [place]: 03/10/19 Time [place]: [phone] Representative spoke with at [place]: [place]) Interventions     Readmission Risk Interventions No flowsheet data found.

## 2019-03-10 NOTE — Op Note (Signed)
PATIENT ID: Garrett Brown.        MRN:  [id number]          DOB/AGE: 06-25-1950 / 68 y.o.    Joni Fears, MD   Biagio Borg, PA-C [address] [place], [place]  [address]                             [phone]   PROGRESS NOTE  Subjective:  negative for Chest Pain  negative for Shortness of Breath  negative for Nausea/Vomiting   negative for Calf Pain    Tolerating Diet: yes         Patient reports pain as mild and moderate.     Comfortable now-no complaints  Objective: Vital signs in last 24 hours:    Patient Vitals for the past 24 hrs:  BP Temp Temp src Pulse Resp SpO2  03/10/19 0457 132/71 98.3 F (36.8 C) Oral 72 16 97 %  03/10/19 0135 130/85 98.6 F (37 C) Oral 78 16 98 %  03/09/19 2047 124/82 98.2 F (36.8 C) Oral 88 18 98 %  03/09/19 1839 115/73 97.9 F (36.6 C) Oral 80 16 99 %  03/09/19 1452 135/80 97.6 F (36.4 C) Oral 79 18 100 %  03/09/19 1404 137/84 97.8 F (36.6 C) Oral 82 18 100 %  03/09/19 1311 125/83 97.6 F (36.4 C) Oral 83 16 100 %  03/09/19 1158 111/78 (!) 97.5 F (36.4 C) Oral 63 14 100 %  03/09/19 1130 115/82 98.4 F (36.9 C) - 64 19 100 %  03/09/19 1100 121/72 - - 72 16 100 %  03/09/19 1045 112/69 - - 67 13 100 %  03/09/19 1030 125/71 - - 69 17 100 %  03/09/19 1015 114/83 (!) 97.5 F (36.4 C) - 79 (!) 21 100 %  03/09/19 1012 120/77 - - 76 (!) 25 100 %      Intake/Output from previous day:   10/27 0701 - 10/28 0700 In: 4543.3 [P.O.:780; I.V.:3313.3] Out: 4450 [Urine:3700]   Intake/Output this shift:   No intake/output data recorded.   Intake/Output      10/27 0701 - 10/28 0700 10/28 0701 - 10/29 0700   P.O. 780    I.V. (mL/kg) 3313.3 (37.1)    IV Piggyback 450    Total Intake(mL/kg) 4543.3 (50.8)    Urine (mL/kg/hr) 3700 (1.7)    Blood 750    Total Output 4450    Net +93.3            LABORATORY DATA: Recent Labs    03/05/19 1450 03/10/19 0511  WBC 10.6* 16.4*  HGB 14.2 11.6*  HCT 43.9 35.7*  PLT 292  232   Recent Labs    03/05/19 1450 03/10/19 0511  NA 138 136  K 3.7 3.8  CL 105 104  CO2 24 24  BUN 20 15  CREATININE 0.89 0.86  GLUCOSE 111* 135*  CALCIUM 8.8* 8.3*   Lab Results  Component Value Date   INR 1.0 03/05/2019   INR 1.12 02/10/2015    Recent Radiographic Studies :  Dg Chest 2 View  Result Date: 03/05/2019 CLINICAL DATA:  68 year old male under preoperative evaluation prior to hip surgery next week. EXAM: CHEST - 2 VIEW COMPARISON:  Chest x-ray 02/10/2015. FINDINGS: Lung volumes are normal. No consolidative airspace disease. No pleural effusions. No pneumothorax. No pulmonary nodule or mass noted. Pulmonary vasculature and the cardiomediastinal silhouette are within normal  limits. IMPRESSION: No radiographic evidence of acute cardiopulmonary disease. Electronically Signed   By: Trudie Reed M.D.   On: 03/05/2019 16:29   Dg Hip Port Unilat With Pelvis 1v Right  Result Date: 03/09/2019 CLINICAL DATA:  Status post right hip replacement. EXAM: DG HIP (WITH OR WITHOUT PELVIS) 1V PORT RIGHT COMPARISON:  December 16, 2018. FINDINGS: Right acetabular and femoral components appear to be well situated. Expected postoperative changes are seen in the surrounding soft tissues. Linear lucency is seen just distal to the distal portion of the femoral prosthesis within the mid femoral shaft which may represent fracture. IMPRESSION: Status post right total hip arthroplasty. Longitudinal lucency is seen within the intramedullary portion of the midshaft of the right femur just distal to femoral prosthesis concerning for nondisplaced fracture. Electronically Signed   By: Lupita Raider M.D.   On: 03/09/2019 11:29     Examination:  General appearance: alert, cooperative and no distress  Wound Exam: clean, dry, intact   Drainage:  None: wound tissue dry  Motor Exam: EHL, FHL, Anterior Tibial and Posterior Tibial Intact  Sensory Exam: Superficial Peroneal, Deep Peroneal and Tibial  normal  Vascular Exam: Normal  Assessment:    1 Day Post-Op  Procedure(s) (LRB): RIGHT TOTAL HIP ARTHROPLASTY (Right)  ADDITIONAL DIAGNOSIS:  Active Problems:   Osteoarthritis of right hip     Plan: Physical Therapy as ordered Partial Weight Bearing @ 50% (PWB)  DVT Prophylaxis:  Aspirin and TED hose  DISCHARGE PLAN: Home  DISCHARGE NEEDS: HHPT and 3-in-1 comode seat  OOB with PT,D/C IV, right hip dressing changed to aquacell.Will plan on D/C today 50% WBing as films note possible non displaced fracture distal to tip of prosthesis, office 2 weeks   Jacqualine Code, PA-C [facility]  03/10/2019 8:43 AM

## 2019-03-10 NOTE — Progress Notes (Signed)
Physical Therapy Treatment Patient Details Name: Garrett Brown. MRN: [id number] DOB: Nov 13, 1950 Today's Date: 03/10/2019    History of Present Illness R posterior THA; PMH of L posterior THA 2016    PT Comments    Pt is ready to DC home from PT standpoint. He ambulated 200' with RW, completed stair training, and demonstrates good understanding of HEP. Reviewed posterior hip precautions in detail.    Follow Up Recommendations  Follow surgeon's recommendation for DC plan and follow-up therapies     Equipment Recommendations  3in1 (PT)    Recommendations for Other Services       Precautions / Restrictions Precautions Precautions: Posterior Hip Precaution Booklet Issued: Yes (comment) Precaution Comments: reviewed posterior hip precautions Required Braces or Orthoses: Knee Immobilizer - Right Knee Immobilizer - Right: On when out of bed or walking Restrictions Weight Bearing Restrictions: No Other Position/Activity Restrictions: WBAT    Mobility  Bed Mobility Overal bed mobility: Modified Independent             General bed mobility comments: HOB up  Transfers Overall transfer level: Needs assistance Equipment used: Rolling walker (2 wheeled) Transfers: Sit to/from Stand Sit to Stand: Supervision         General transfer comment: VCs for hand placement  Ambulation/Gait Ambulation/Gait assistance: Supervision Gait Distance (Feet): 200 Feet Assistive device: Rolling walker (2 wheeled) Gait Pattern/deviations: Step-to pattern;Decreased stride length Gait velocity: decr   General Gait Details: VCs sequencing, no loss of balance, VCs to maintain precautions with turns   Stairs Stairs: Yes Stairs assistance: Min assist Stair Management: No rails;Backwards;Step to pattern;With walker Number of Stairs: 2 General stair comments: VCs sequencing   Wheelchair Mobility    Modified Rankin (Stroke Patients Only)       Balance Overall balance  assessment: Modified Independent                                          Cognition Arousal/Alertness: Awake/alert Behavior During Therapy: WFL for tasks assessed/performed Overall Cognitive Status: Within Functional Limits for tasks assessed                                        Exercises Total Joint Exercises Ankle Circles/Pumps: AROM;Both;10 reps;Supine Quad Sets: AROM;Right;5 reps;Supine Short Arc Quad: AROM;Right;10 reps;Supine Heel Slides: AAROM;Right;10 reps;Supine Hip ABduction/ADduction: AAROM;Right;10 reps;Supine    General Comments        Pertinent Vitals/Pain Pain Score: 5  Pain Location: R hip Pain Descriptors / Indicators: Sore Pain Intervention(s): Limited activity within patient's tolerance;Monitored during session;Premedicated before session;Ice applied    Home Living                      Prior Function            PT Goals (current goals can now be found in the care plan section) Acute Rehab PT Goals Patient Stated Goal: travel, golf PT Goal Formulation: With patient Time For Goal Achievement: 03/15/19 Potential to Achieve Goals: Good Progress towards PT goals: Progressing toward goals    Frequency    7X/week      PT Plan Current plan remains appropriate    Co-evaluation              AM-PAC PT "6 Clicks" Mobility  Outcome Measure  Help needed turning from your back to your side while in a flat bed without using bedrails?: None Help needed moving from lying on your back to sitting on the side of a flat bed without using bedrails?: None Help needed moving to and from a bed to a chair (including a wheelchair)?: None Help needed standing up from a chair using your arms (e.g., wheelchair or bedside chair)?: None Help needed to walk in hospital room?: None Help needed climbing 3-5 steps with a railing? : A Little 6 Click Score: 23    End of Session Equipment Utilized During Treatment: Gait  belt Activity Tolerance: Patient tolerated treatment well Patient left: in chair;with call bell/phone within reach Nurse Communication: Mobility status PT Visit Diagnosis: Pain;Difficulty in walking, not elsewhere classified (R26.2) Pain - Right/Left: Right Pain - part of body: Hip     Time: 3149-7026 PT Time Calculation (min) (ACUTE ONLY): 23 min  Charges:  $Gait Training: 8-22 mins $Therapeutic Exercise: 8-22 mins                    Ralene Bathe Kistler PT 03/10/2019  Acute Rehabilitation Services Pager [phone] Office [phone]

## 2019-03-11 ENCOUNTER — Encounter (HOSPITAL_COMMUNITY): Payer: Self-pay | Admitting: Orthopaedic Surgery

## 2019-03-24 ENCOUNTER — Other Ambulatory Visit: Payer: Self-pay

## 2019-03-24 ENCOUNTER — Ambulatory Visit (INDEPENDENT_AMBULATORY_CARE_PROVIDER_SITE_OTHER): Payer: Medicare Other

## 2019-03-24 ENCOUNTER — Ambulatory Visit (INDEPENDENT_AMBULATORY_CARE_PROVIDER_SITE_OTHER): Payer: Medicare Other | Admitting: Orthopaedic Surgery

## 2019-03-24 ENCOUNTER — Encounter: Payer: Self-pay | Admitting: Orthopaedic Surgery

## 2019-03-24 DIAGNOSIS — M1611 Unilateral primary osteoarthritis, right hip: Secondary | ICD-10-CM

## 2019-03-24 DIAGNOSIS — M25551 Pain in right hip: Secondary | ICD-10-CM

## 2019-03-24 NOTE — Progress Notes (Signed)
Office Visit Note   Patient: Garrett Brown.           Date of Birth: [birth year]           MRN: [id number] Visit Date: 03/24/2019              Requested by: Lavada Mesi, MD [address] [place],  [place] [address] PCP: Lavada Mesi, MD   Assessment & Plan: Visit Diagnoses:  1. Pain in right hip     Plan: 2 weeks status post primary right total hip replacement and doing well.  Using a cane.  Taking very little pain medicine.  Wound is healing without an issue.  I did remove the staples and applied Steri-Strips over benzoin.  Leg lengths appear to be symmetrical.  Long discussion regarding hip precautions.  His bone was a little soft at the time of surgery which could be related to his smoking.  I did deepen the acetabulum as it was quite shallow and the bone was somewhat soft.  He had good position of the component.  I used a 15 mm neck to provide stability.  I am a little concerned about hip dislocation and want him to be careful.  Had an excellent capsular closure.  Will reevaluate in 2 weeks.  No driving  Follow-Up Instructions: No follow-ups on file.   Orders:  Orders Placed This Encounter  Procedures  . XR HIP UNILAT W OR W/O PELVIS 2-3 VIEWS RIGHT   No orders of the defined types were placed in this encounter.     Procedures: No procedures performed   Clinical Data: No additional findings.   Subjective: Chief Complaint  Patient presents with  . Right Hip - Pain    Pt comes in today for post op check. He is 15 days PO. Ambulates with cane. Doing well. Some pain. Takes Dilaudid PRN. Getting stronger. Doing HHPT. Staples intact. Incision looks good. Staples were removed today and sterri strips were applied       HPI  Review of Systems   Objective: Vital Signs: There were no vitals taken for this visit.  Physical Exam  Ortho Exam neurologically intact.  Painless range of motion of right hip.  Leg lengths appear to be symmetrical.  No distal edema.   Right hip incision healing without problem.  Clips removed and Steri-Strips applied  Specialty Comments:  No specialty comments available.  Imaging: No results found.   PMFS History: Patient Active Problem List   Diagnosis Date Noted  . Osteoarthritis of right hip 03/09/2019  . Primary osteoarthritis of right hip 02/23/2019  . Tobacco abuse 02/12/2019  . 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 Arthritis   . Constipation due to pain medication     History reviewed. No pertinent family history.  Past Surgical History:  Procedure Laterality Date  . COLONOSCOPY W/ POLYPECTOMY    . TOTAL HIP ARTHROPLASTY Left 02/21/2015  . TOTAL HIP ARTHROPLASTY Left 02/21/2015   Procedure: TOTAL LEFT HIP ARTHROPLASTY;  Surgeon: Valeria Batman, MD;  Location: [facility] OR;  Service: Orthopedics;  Laterality: Left;  . TOTAL HIP ARTHROPLASTY Right 03/09/2019   Procedure: RIGHT TOTAL HIP ARTHROPLASTY;  Surgeon: Valeria Batman, MD;  Location: [facility] ORS;  Service: Orthopedics;  Laterality: Right;  Marland Kitchen VARICOCELE EXCISION  1989   Social History   Occupational History  . Not on file  Tobacco Use  . Smoking status: Current Every Day Smoker  Packs/day: 0.50    Years: 20.00    Pack years: 10.00    Types: Cigarettes  . Smokeless tobacco: Never Used  Substance and Sexual Activity  . Alcohol use: Yes    Comment: social  . Drug use: Yes    Types: Marijuana    Comment: states used yesterday 03/04/2019  . Sexual activity: Not on file

## 2019-04-12 ENCOUNTER — Telehealth: Payer: Self-pay | Admitting: Radiology

## 2019-04-12 ENCOUNTER — Telehealth: Payer: Self-pay | Admitting: Orthopedic Surgery

## 2019-04-12 DIAGNOSIS — S72331A Displaced oblique fracture of shaft of right femur, initial encounter for closed fracture: Secondary | ICD-10-CM | POA: Insufficient documentation

## 2019-04-12 DIAGNOSIS — R7303 Prediabetes: Secondary | ICD-10-CM | POA: Insufficient documentation

## 2019-04-12 NOTE — Telephone Encounter (Signed)
Received incoming call from answering service. Patient called and stated he had recent total hip replacement. He fell today and his hip is in "really bad shape". He is currently waiting for EMS and is trying to be brought to ED here instead of [facility]. He left phone number for call back.  I called and had to leave voicemail for patient. Advised that Dr. Durward Fortes is not in the office today, however, he should relay surgery information to ED and they will reach out to Dr. Durward Fortes or the physician on call for our practice. Did ask that patient call back if he needs anything further.

## 2019-04-12 NOTE — Telephone Encounter (Signed)
Mr. Kaps called to let Dr. Durward Fortes know that he was at [facility].  He fell at work and believes he fractured his femur.  He is s/p Right THA on 03/09/19 and says he feels like his hip is fine.

## 2019-04-12 NOTE — Telephone Encounter (Signed)
Called pts cell phone and left message

## 2019-04-12 NOTE — Telephone Encounter (Signed)
Thanks-will attempt to reach via cell phone

## 2019-04-13 ENCOUNTER — Ambulatory Visit: Payer: Medicare Other | Admitting: Orthopaedic Surgery

## 2019-04-14 DIAGNOSIS — Z96643 Presence of artificial hip joint, bilateral: Secondary | ICD-10-CM | POA: Insufficient documentation

## 2019-04-19 ENCOUNTER — Telehealth: Payer: Self-pay | Admitting: Orthopaedic Surgery

## 2019-04-19 NOTE — Telephone Encounter (Signed)
[facility] patient  

## 2019-04-19 NOTE — Telephone Encounter (Signed)
See below. Patient wants to speak with you.

## 2019-04-19 NOTE — Telephone Encounter (Signed)
Returned patient call. Patient states that he fall at work. Patient is looking for a call back from Dr. Durward Fortes. Patient states he has broken his femur. Patient stated he had surgery 03/09/2019 and re injured himself. Patient phone number is [phone].

## 2019-04-20 NOTE — Telephone Encounter (Signed)
called

## 2019-06-09 DIAGNOSIS — M8589 Other specified disorders of bone density and structure, multiple sites: Secondary | ICD-10-CM | POA: Insufficient documentation

## 2019-06-09 DIAGNOSIS — Z9181 History of falling: Secondary | ICD-10-CM | POA: Insufficient documentation

## 2019-06-09 DIAGNOSIS — Z13820 Encounter for screening for osteoporosis: Secondary | ICD-10-CM | POA: Insufficient documentation

## 2019-08-12 ENCOUNTER — Telehealth: Payer: Self-pay | Admitting: Family Medicine

## 2019-08-12 NOTE — Telephone Encounter (Signed)
Received a call from [facility] bone density clinic.  He had a hip fracture from ground level fall.  SPEP was mildly abnormal and needs to be monitored.  Vitamin D still severely low and he's still a smoker.  We will take over bone management.

## 2020-04-03 ENCOUNTER — Other Ambulatory Visit: Payer: Self-pay

## 2020-04-03 ENCOUNTER — Ambulatory Visit (INDEPENDENT_AMBULATORY_CARE_PROVIDER_SITE_OTHER): Payer: Medicare Other

## 2020-04-03 ENCOUNTER — Ambulatory Visit (INDEPENDENT_AMBULATORY_CARE_PROVIDER_SITE_OTHER): Payer: Medicare Other | Admitting: Orthopaedic Surgery

## 2020-04-03 ENCOUNTER — Encounter: Payer: Self-pay | Admitting: Orthopaedic Surgery

## 2020-04-03 VITALS — Ht 70.5 in | Wt 197.0 lb

## 2020-04-03 DIAGNOSIS — M7989 Other specified soft tissue disorders: Secondary | ICD-10-CM | POA: Diagnosis not present

## 2020-04-03 DIAGNOSIS — Z96643 Presence of artificial hip joint, bilateral: Secondary | ICD-10-CM

## 2020-04-03 NOTE — Progress Notes (Signed)
Office Visit Note   Patient: Garrett Brown.           Date of Birth: February 15, 1951           MRN: [id number] Visit Date: 04/03/2020              Requested by: Lavada Mesi, MD [address] [place],  [place] [address] PCP: Lavada Mesi, MD   Assessment & Plan: Visit Diagnoses:  1. History of total replacement of both hip joints   2. Calcification of soft tissue     Plan:  #1: Continue conservative treatment.  Possible use of Voltaren gel to that area. #2: If he continues to have problems then we could possibly try a cortisone injection but he states he is not bad enough for that at this time.  Follow-Up Instructions: Return if symptoms worsen or fail to improve.   Orders:  Orders Placed This Encounter  Procedures  . XR HIPS BILAT W OR W/O PELVIS 3-4 VIEWS   No orders of the defined types were placed in this encounter.     Procedures: No procedures performed   Clinical Data: No additional findings.   Subjective: Chief Complaint  Patient presents with  . Right Hip - Follow-up    Right total hip arthroplasty 03/09/2019   HPI To my daughter calls me Says that at patient presents today for follow up on his right hip. He had his right hip replaced on 03/09/2019. He said that his right hip is doing well, and has no complaints. He has been released from the right femur fracture. He had his left hip replaced in 2016 with Dr.Whitfield. He said that he does not have pain, but does have some discomfort on and off. He said that there are times that it "just doesn't feel right". He wants to have it checked out and x-rayed.    Review of Systems   Objective: Vital Signs: Ht 5' 10.5" (1.791 m)   Wt 197 lb (89.4 kg)   BMI 27.87 kg/m   Physical Exam Constitutional:      Appearance: Normal appearance. He is well-developed and normal weight.  HENT:     Head: Normocephalic.     Mouth/Throat:     Mouth: Mucous membranes are moist.  Eyes:     Pupils: Pupils are  equal, round, and reactive to light.  Pulmonary:     Effort: Pulmonary effort is normal.  Skin:    General: Skin is warm and dry.  Neurological:     Mental Status: He is alert and oriented to person, place, and time.  Psychiatric:        Mood and Affect: Mood normal.        Behavior: Behavior normal.     Ortho Exam  Exam today reveals well-healed surgical incisions over both hips as well as the right distal femur laterally.  He is got about 30 degrees of internal and external rotation of both hips.  Can easily get to 90 degrees of forward flexion without difficulty.   Specialty Comments:  No specialty comments available.  Imaging: XR HIPS BILAT W OR W/O PELVIS 3-4 VIEWS  Result Date: 04/03/2020 X-ray of the pelvis and bilateral hips reveals excellent position alignment of the both hips. There is a plate noted on the lateral aspect of the right femur with cerclage wire. There is no signs of any fracture distally. Left hip reveals some calcification at the greater trochanteric region. Otherwise no occult pathology.  PMFS History: Current Outpatient Medications  Medication Sig Dispense Refill  . aspirin 81 MG chewable tablet Chew 1 tablet (81 mg total) by mouth 2 (two) times daily.    Marland Kitchen HYDROmorphone (DILAUDID) 2 MG tablet Take 1 tablet (2 mg total) by mouth every 4 (four) hours as needed for moderate pain or severe pain. (Patient not taking: Reported on 04/03/2020) 40 tablet 0  . methocarbamol (ROBAXIN) 500 MG tablet Take 1 tablet (500 mg total) by mouth every 8 (eight) hours as needed for muscle spasms. (Patient not taking: Reported on 04/03/2020) 30 tablet 0   No current facility-administered medications for this visit.    Patient Active Problem List   Diagnosis Date Noted  . Calcification of soft tissue 04/03/2020  . Osteoarthritis of right hip 03/09/2019  . Primary osteoarthritis of right hip 02/23/2019  . Tobacco abuse 02/12/2019  . Status post total hip replacement,  bilateral 02/21/2015   Past Medical History:  Diagnosis Date  . Arthritis   . Constipation due to pain medication     History reviewed. No pertinent family history.  Past Surgical History:  Procedure Laterality Date  . COLONOSCOPY W/ POLYPECTOMY    . TOTAL HIP ARTHROPLASTY Left 02/21/2015  . TOTAL HIP ARTHROPLASTY Left 02/21/2015   Procedure: TOTAL LEFT HIP ARTHROPLASTY;  Surgeon: Valeria Batman, MD;  Location: [facility] OR;  Service: Orthopedics;  Laterality: Left;  . TOTAL HIP ARTHROPLASTY Right 03/09/2019   Procedure: RIGHT TOTAL HIP ARTHROPLASTY;  Surgeon: Valeria Batman, MD;  Location: [facility] ORS;  Service: Orthopedics;  Laterality: Right;  Marland Kitchen VARICOCELE EXCISION  1989   Social History   Occupational History  . Not on file  Tobacco Use  . Smoking status: Current Every Day Smoker    Packs/day: 0.50    Years: 20.00    Pack years: 10.00    Types: Cigarettes  . Smokeless tobacco: Never Used  Vaping Use  . Vaping Use: Never used  Substance and Sexual Activity  . Alcohol use: Yes    Comment: social  . Drug use: Yes    Types: Marijuana    Comment: states used yesterday 03/04/2019  . Sexual activity: Not on file

## 2021-08-27 ENCOUNTER — Encounter: Payer: Self-pay | Admitting: Physician Assistant

## 2021-08-27 ENCOUNTER — Ambulatory Visit (INDEPENDENT_AMBULATORY_CARE_PROVIDER_SITE_OTHER): Payer: Medicare Other | Admitting: Physician Assistant

## 2021-08-27 ENCOUNTER — Ambulatory Visit (INDEPENDENT_AMBULATORY_CARE_PROVIDER_SITE_OTHER): Payer: Medicare Other

## 2021-08-27 DIAGNOSIS — M25551 Pain in right hip: Secondary | ICD-10-CM

## 2021-08-27 NOTE — Progress Notes (Signed)
? ?Office Visit Note ?  ?Patient: Garrett Brown.           ?Date of Birth: 06/27/1950           ?MRN: [id number] ?Visit Date: 08/27/2021 ?             ?Requested by: Lavada Mesi, MD ?[address] ?[address] ?[place],  [place] [address] ?PCP: Lavada Mesi, MD ? ?Chief Complaint  ?Patient presents with  ? Right Hip - Pain  ? ? ? ? ?HPI: ?Patient is a pleasant 71 year old gentleman who is status post right total hip arthroplasty done by Dr. Cleophas Dunker in 2020.  Also status post left hip replacement done by Dr. Cleophas Dunker in 2016.  Unfortunately after his right hip replacement he did fall at work in 2020 and sustained a femur fracture.  This was fixed by Dr. Noralyn Pick in [place] and was a separate work comp issue from his hip.  He comes in today because he said he had some burning in both of his legs.  It was bad enough that he had difficulty bearing weight on the right.  It is now since resolved and he has no symptoms.  Denies any recent illness fever chills or malaise ? ?Assessment & Plan: ?Visit Diagnoses:  ?1. Pain in right hip   ? ? ?Plan: Spoke to him that some of the burning in his hips may be related to his back.  Burning certainly be would be more consistent and it did improve with taking Aleve.  He does not have any weakness any loss of bowel or bladder control.  He is going to come back to have his leg evaluated under work comp if it returns to having significant difficulties for his ambulation ? ?Follow-Up Instructions: No follow-ups on file.  ? ?Ortho Exam ? ?Patient is alert, oriented, no adenopathy, well-dressed, normal affect, normal respiratory effort. ?Examination bilateral hips well-healed surgical incisions no redness no swelling no tenderness over the Trope bursa.  Bilaterally he has good internal and external rotation at 30 degrees no pain reproducible in the groin.  Good strength with dorsiflexion plantarflexion knee extension and hip flexion ? ?Imaging: ?XR HIP UNILAT W OR W/O PELVIS  2-3 VIEWS RIGHT ? ?Result Date: 08/27/2021 ?Radiographs of his right hip done demonstrating previous history of right hip replacement.  Also left hip replacement is noticed as well.  He also has a lateral plate with cerclage wires.  Do not see any evidence of loosening or fracture.  These x-rays were compared to x-rays done in November 2021 and 2022 do not appreciate any changes  ?No images are attached to the encounter. ? ?Labs: ?Lab Results  ?Component Value Date  ? REPTSTATUS 03/07/2019 FINAL 03/05/2019  ? CULT (A) 03/05/2019  ?  <10,000 COLONIES/mL INSIGNIFICANT GROWTH ?Performed at [facility], [address], [place], [place] [address] ?  ? ? ? ?Lab Results  ?Component Value Date  ? ALBUMIN 4.0 03/05/2019  ? ALBUMIN 4.3 02/10/2015  ? ? ?No results found for: MG ?Lab Results  ?Component Value Date  ? VD25OH 10 (L) 02/12/2019  ? ? ?No results found for: PREALBUMIN ? ?  Latest Ref Rng & Units 03/10/2019  ?  5:11 AM 03/05/2019  ?  2:50 PM 02/12/2019  ?  1:34 PM  ?CBC EXTENDED  ?WBC 4.0 - 10.5 K/uL 16.4   10.6   9.7    ?RBC 4.22 - 5.81 MIL/uL 3.86   4.73   5.09    ?  Hemoglobin 13.0 - 17.0 g/dL 37.9   02.4   09.7    ?HCT 39.0 - 52.0 % 35.7   43.9   45.5    ?Platelets 150 - 400 K/uL 232   292   320    ?NEUT# 1.7 - 7.7 K/uL  7.1   6,402    ?Lymph# 0.7 - 4.0 K/uL  2.5   2,347    ? ? ? ?There is no height or weight on file to calculate BMI. ? ?Orders:  ?Orders Placed This Encounter  ?Procedures  ? XR HIP UNILAT W OR W/O PELVIS 2-3 VIEWS RIGHT  ? ?No orders of the defined types were placed in this encounter. ? ? ? Procedures: ?No procedures performed ? ?Clinical Data: ?No additional findings. ? ?ROS: ? ?All other systems negative, except as noted in the HPI. ?Review of Systems ? ?Objective: ?Vital Signs: There were no vitals taken for this visit. ? ?Specialty Comments:  ?No specialty comments available. ? ?PMFS History: ?Patient Active Problem List  ? Diagnosis Date Noted  ? Calcification of soft tissue 04/03/2020  ?  Osteoarthritis of right hip 03/09/2019  ? Primary osteoarthritis of right hip 02/23/2019  ? Tobacco abuse 02/12/2019  ? Status post total hip replacement, bilateral 02/21/2015  ? ?Past Medical History:  ?Diagnosis Date  ? Arthritis   ? Constipation due to pain medication   ?  ?No family history on file.  ?Past Surgical History:  ?Procedure Laterality Date  ? COLONOSCOPY W/ POLYPECTOMY    ? TOTAL HIP ARTHROPLASTY Left 02/21/2015  ? TOTAL HIP ARTHROPLASTY Left 02/21/2015  ? Procedure: TOTAL LEFT HIP ARTHROPLASTY;  Surgeon: Valeria Batman, MD;  Location[facility] OR;  Service: Orthopedics;  Laterality: Left;  ? TOTAL HIP ARTHROPLASTY Right 03/09/2019  ? Procedure: RIGHT TOTAL HIP ARTHROPLASTY;  Surgeon: Valeria Batman, MD;  Location: [facility] ORS;  Service: Orthopedics;  Laterality: Right;  ? VARICOCELE EXCISION  1989  ? ?Social History  ? ?Occupational History  ? Not on file  ?Tobacco Use  ? Smoking status: Every Day  ?  Packs/day: 0.50  ?  Years: 20.00  ?  Pack years: 10.00  ?  Types: Cigarettes  ? Smokeless tobacco: Never  ?Vaping Use  ? Vaping Use: Never used  ?Substance and Sexual Activity  ? Alcohol use: Yes  ?  Comment: social  ? Drug use: Yes  ?  Types: Marijuana  ?  Comment: states used yesterday 03/04/2019  ? Sexual activity: Not on file  ? ? ? ? ? ?

## 2021-09-09 IMAGING — DX DG CHEST 2V
2 series · 2 of 2 positions shown · non-contrast
Comparison: Chest x-ray 02/10/2015.

CLINICAL DATA: 67-year-old male under preoperative evaluation prior
to hip surgery next week.

EXAM:
CHEST - 2 VIEW

[chest pa]
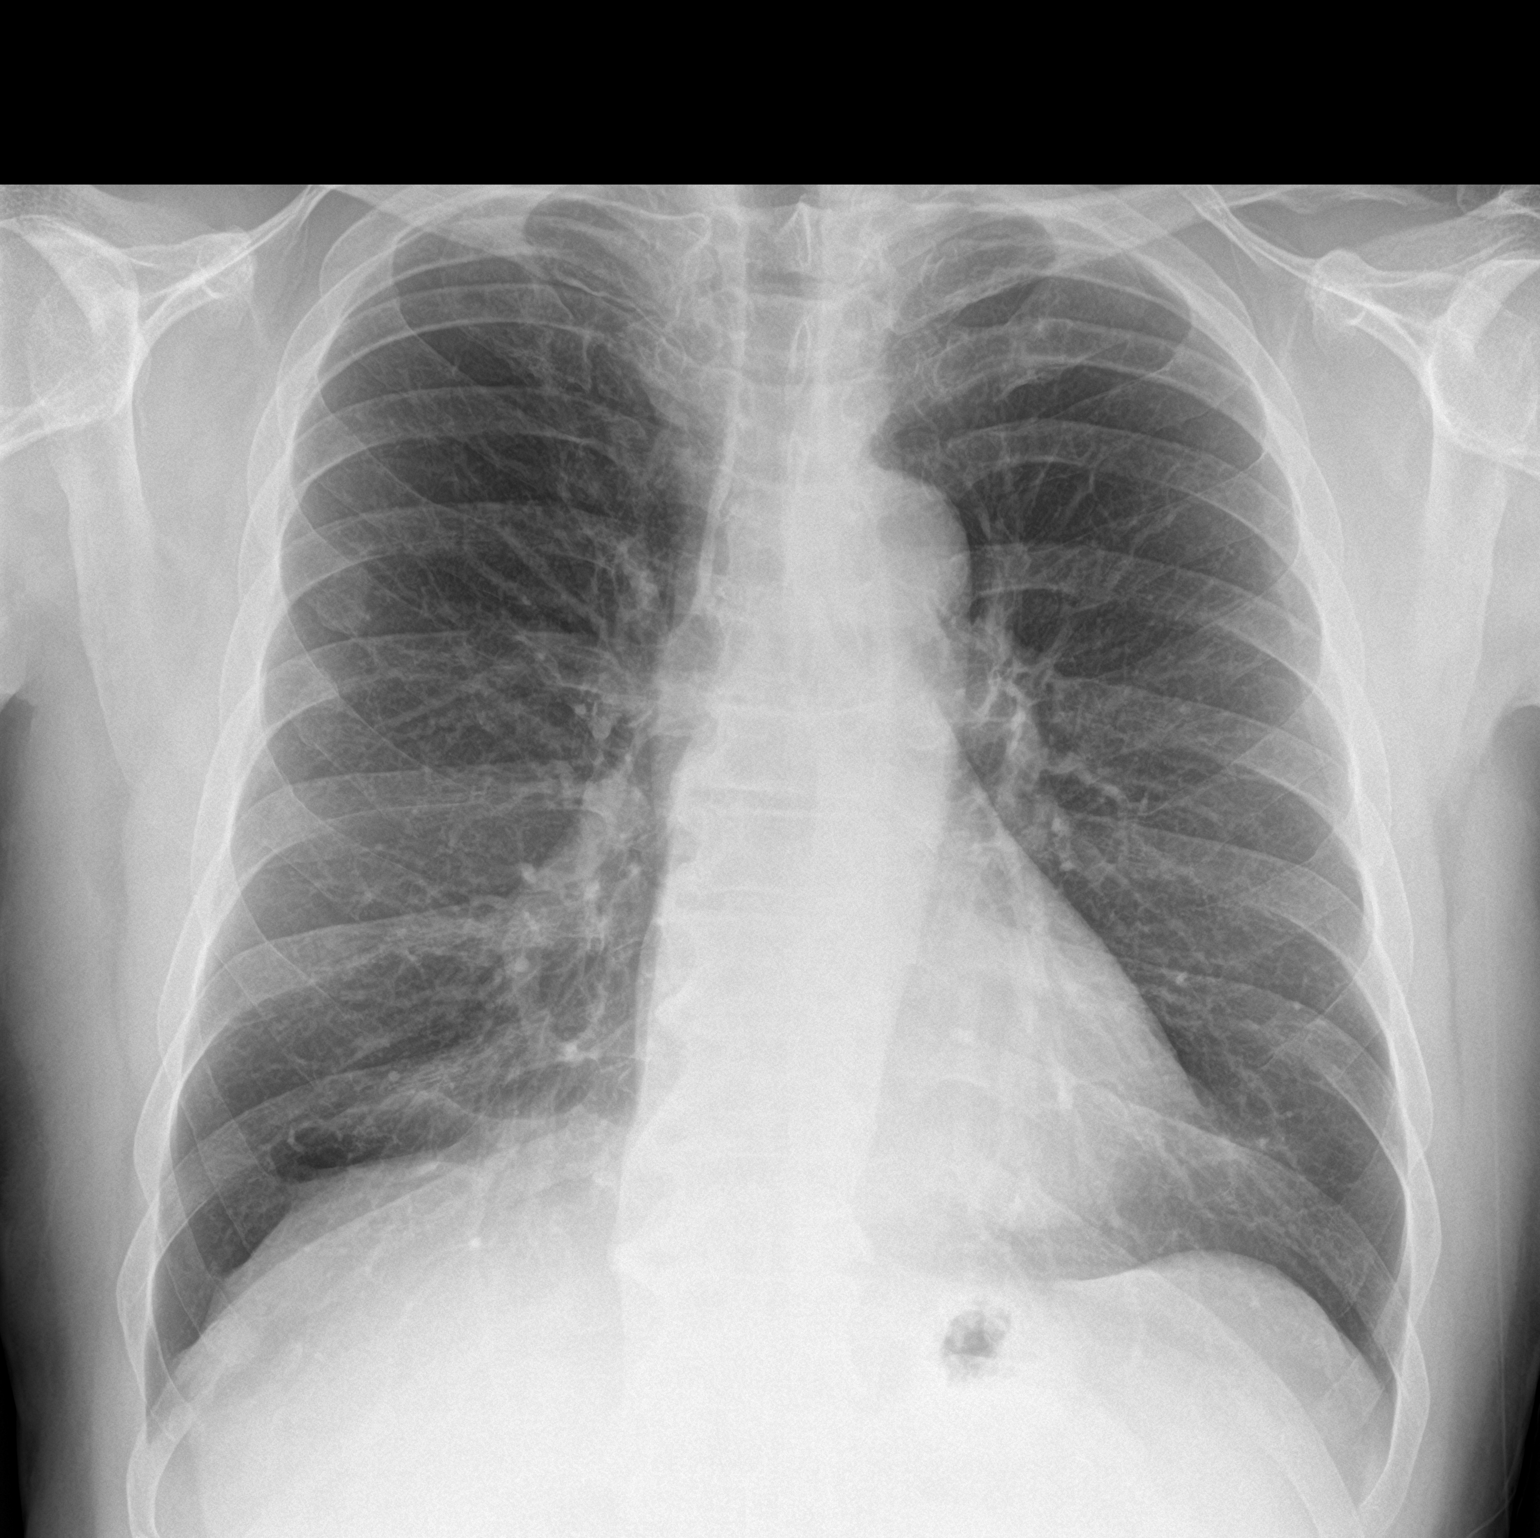

[chest lat]
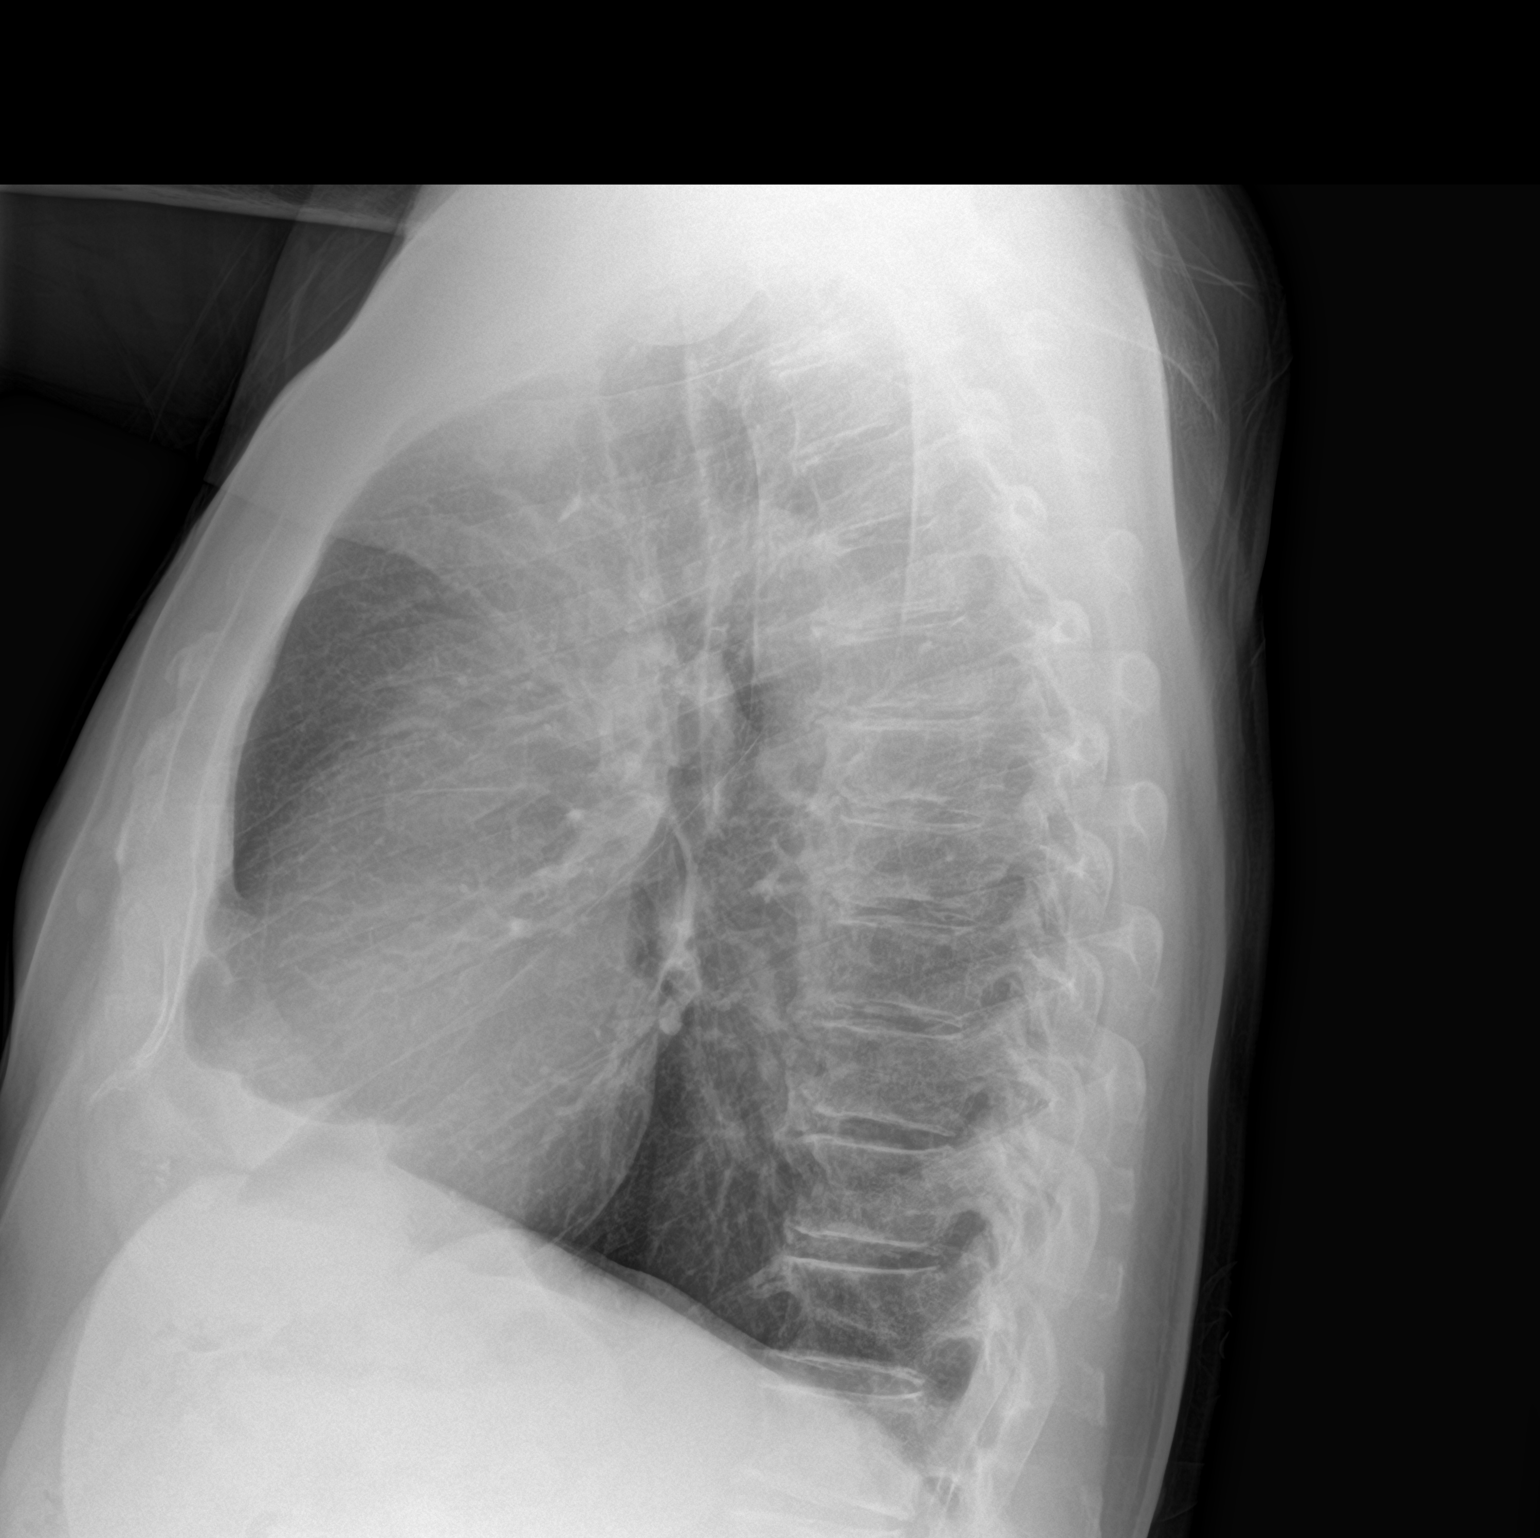

[2 of 2 positions shown; findings below may reference images not displayed]

FINDINGS: Lung volumes are normal. No consolidative airspace disease. No
pleural effusions. No pneumothorax. No pulmonary nodule or mass
noted. Pulmonary vasculature and the cardiomediastinal silhouette
are within normal limits.
IMPRESSION: No radiographic evidence of acute cardiopulmonary disease.

## 2021-09-16 ENCOUNTER — Ambulatory Visit (INDEPENDENT_AMBULATORY_CARE_PROVIDER_SITE_OTHER): Payer: Medicare Other

## 2021-09-16 ENCOUNTER — Ambulatory Visit (HOSPITAL_COMMUNITY)
Admission: EM | Admit: 2021-09-16 | Discharge: 2021-09-16 | Disposition: A | Discharge status: Home / Self Care | Payer: Medicare Other

## 2021-09-16 ENCOUNTER — Encounter (HOSPITAL_COMMUNITY): Payer: Self-pay

## 2021-09-16 DIAGNOSIS — R030 Elevated blood-pressure reading, without diagnosis of hypertension: Secondary | ICD-10-CM

## 2021-09-16 DIAGNOSIS — R059 Cough, unspecified: Secondary | ICD-10-CM | POA: Diagnosis not present

## 2021-09-16 DIAGNOSIS — J441 Chronic obstructive pulmonary disease with (acute) exacerbation: Secondary | ICD-10-CM

## 2021-09-16 DIAGNOSIS — R051 Acute cough: Secondary | ICD-10-CM

## 2021-09-16 DIAGNOSIS — R0602 Shortness of breath: Secondary | ICD-10-CM

## 2021-09-16 MED ORDER — PREDNISONE 20 MG PO TABS: 40.0000 mg | ORAL_TABLET | Freq: Every day | ORAL | 0 refills | Status: AC

## 2021-09-16 MED ORDER — ALBUTEROL SULFATE HFA 108 (90 BASE) MCG/ACT IN AERS
INHALATION_SPRAY | RESPIRATORY_TRACT | Status: AC
  Filled 2021-09-16: qty 6.7

## 2021-09-16 MED ORDER — DOXYCYCLINE HYCLATE 100 MG PO CAPS: 100.0000 mg | ORAL_CAPSULE | Freq: Two times a day (BID) | ORAL | 0 refills | Status: DC

## 2021-09-16 MED ORDER — ALBUTEROL SULFATE HFA 108 (90 BASE) MCG/ACT IN AERS
2.0000 | INHALATION_SPRAY | Freq: Once | RESPIRATORY_TRACT | Status: AC
  Administered 2021-09-16: 2 via RESPIRATORY_TRACT

## 2021-09-16 NOTE — ED Provider Notes (Signed)
?[facility] ? ? ? ?CSN: [id number] ?Arrival date & time: 09/16/21  1503 ? ? ?  ? ?History   ?Chief Complaint ?Chief Complaint  ?Patient presents with  ? Cough  ? Shortness of Breath  ? ? ?HPI ?Garrett Brown. is a 71 y.o. male.  ? ?Patient presents today with a weeklong history of worsening cough.  He reports associated shortness of breath.  Reports cough is productive with thick purulent sputum.  He denies any chest pain, fever, nausea, vomiting.  He does report that he has had several episodes of lightheadedness following coughing fits that last for few seconds and then resolved.  Denies any syncopal episodes.  He denies any recent antibiotic or steroid use.  He has tried Aleve without improvement of symptoms.  He denies diagnosis of asthma or COPD but is a current everyday smoker.  Denies any known sick contacts.  He is having difficulty with daily activities as a result of symptoms. ? ?Blood pressure is elevated today.  Patient denies formal diagnosis of hypertension.  He has been taking NSAIDs (Aleve) on a regular basis due to chronic pain related to previous orthopedic surgeries.  He denies any chest pain, headache, dizziness, nausea, vomiting.  He does report shortness of breath but believes this is related to cough. ? ? ?Past Medical History:  ?Diagnosis Date  ? Arthritis   ? Constipation due to pain medication   ? ? ?Patient Active Problem List  ? Diagnosis Date Noted  ? Calcification of soft tissue 04/03/2020  ? Osteoarthritis of right hip 03/09/2019  ? Primary osteoarthritis of right hip 02/23/2019  ? Tobacco abuse 02/12/2019  ? Status post total hip replacement, bilateral 02/21/2015  ? ? ?Past Surgical History:  ?Procedure Laterality Date  ? COLONOSCOPY W/ POLYPECTOMY    ? TOTAL HIP ARTHROPLASTY Left 02/21/2015  ? TOTAL HIP ARTHROPLASTY Left 02/21/2015  ? Procedure: TOTAL LEFT HIP ARTHROPLASTY;  Surgeon: Valeria Batman, MD;  Location: [facility] OR;  Service: Orthopedics;  Laterality: Left;  ?  TOTAL HIP ARTHROPLASTY Right 03/09/2019  ? Procedure: RIGHT TOTAL HIP ARTHROPLASTY;  Surgeon: Valeria Batman, MD;  Location: [facility] ORS;  Service: Orthopedics;  Laterality: Right;  ? VARICOCELE EXCISION  1989  ? ? ? ? ? ?Home Medications   ? ?Prior to Admission medications   ?Medication Sig Start Date End Date Taking? Authorizing Provider  ?acetaminophen (TYLENOL) 500 MG tablet Take 1,000 mg by mouth every 6 (six) hours as needed for pain.   Yes [provider]  ?doxycycline (VIBRAMYCIN) 100 MG capsule Take 1 capsule (100 mg total) by mouth 2 (two) times daily. 09/16/21  Yes Blinda Turek K, PA-C  ?predniSONE (DELTASONE) 20 MG tablet Take 2 tablets (40 mg total) by mouth daily for 5 days. 09/16/21 09/21/21 Yes Euleta Belson, Noberto Retort, PA-C  ? ? ?Family History ?History reviewed. No pertinent family history. ? ?Social History ?Social History  ? ?Tobacco Use  ? Smoking status: Every Day  ?  Packs/day: 1.00  ?  Years: 20.00  ?  Pack years: 20.00  ?  Types: Cigarettes  ? Smokeless tobacco: Never  ?Vaping Use  ? Vaping Use: Never used  ?Substance Use Topics  ? Alcohol use: Yes  ?  Comment: social  ? Drug use: Yes  ?  Frequency: 3.0 times per week  ?  Types: Marijuana  ? ? ? ?Allergies   ?Patient has no known allergies. ? ? ?Review of Systems ?Review of Systems  ?Constitutional:  Positive for activity change. Negative for appetite change, fatigue and fever.  ?HENT:  Positive for congestion. Negative for sinus pressure, sneezing and sore throat.   ?Respiratory:  Positive for shortness of breath. Negative for cough.   ?Cardiovascular:  Negative for chest pain and palpitations.  ?Gastrointestinal:  Negative for abdominal pain, diarrhea, nausea and vomiting.  ?Neurological:  Positive for light-headedness (intermittent with coughing). Negative for dizziness and headaches.  ? ? ?Physical Exam ?Triage Vital Signs ?ED Triage Vitals  ?Enc Vitals Group  ?   BP 09/16/21 1706 (!) 165/98  ?   Pulse Rate 09/16/21 1706 68  ?   Resp 09/16/21  1706 18  ?   Temp 09/16/21 1706 97.6 ?F (36.4 ?C)  ?   Temp Source 09/16/21 1706 Oral  ?   SpO2 09/16/21 1706 99 %  ?   Weight --   ?   Height --   ?   Head Circumference --   ?   Peak Flow --   ?   Pain Score 09/16/21 1707 0  ?   Pain Loc --   ?   Pain Edu? --   ?   Excl. in GC? --   ? ?No data found. ? ?Updated Vital Signs ?BP (!) 155/90 (BP Location: Right Arm)   Pulse 71   Temp 98.1 ?F (36.7 ?C) (Oral)   Resp 18   SpO2 98%  ? ?Visual Acuity ?Right Eye Distance:   ?Left Eye Distance:   ?Bilateral Distance:   ? ?Right Eye Near:   ?Left Eye Near:    ?Bilateral Near:    ? ?Physical Exam ?Vitals reviewed.  ?Constitutional:   ?   General: He is awake.  ?   Appearance: Normal appearance. He is well-developed. He is not ill-appearing.  ?   Comments: Very pleasant male appears stated age in no acute distress sitting comfortably in exam room  ?HENT:  ?   Head: Normocephalic and atraumatic.  ?   Right Ear: Tympanic membrane, ear canal and external ear normal. Tympanic membrane is not erythematous or bulging.  ?   Left Ear: Tympanic membrane, ear canal and external ear normal. Tympanic membrane is not erythematous or bulging.  ?   Nose: Nose normal.  ?   Mouth/Throat:  ?   Pharynx: Uvula midline. No oropharyngeal exudate, posterior oropharyngeal erythema or uvula swelling.  ?Cardiovascular:  ?   Rate and Rhythm: Normal rate and regular rhythm.  ?   Heart sounds: Normal heart sounds, S1 normal and S2 normal. No murmur heard. ?Pulmonary:  ?   Effort: Pulmonary effort is normal. No accessory muscle usage or respiratory distress.  ?   Breath sounds: No stridor. Examination of the right-lower field reveals decreased breath sounds. Examination of the left-lower field reveals decreased breath sounds. Decreased breath sounds present. No wheezing, rhonchi or rales.  ?   Comments: Decreased aeration bilateral bases ?Abdominal:  ?   General: Bowel sounds are normal.  ?   Palpations: Abdomen is soft.  ?   Tenderness: There is no  abdominal tenderness.  ?Neurological:  ?   Mental Status: He is alert.  ?Psychiatric:     ?   Behavior: Behavior is cooperative.  ? ? ? ?UC Treatments / Results  ?Labs ?(all labs ordered are listed, but only abnormal results are displayed) ?Labs Reviewed - No data to display ? ?EKG ? ? ?Radiology ?DG Chest 2 View ? ?Result Date: 09/16/2021 ?CLINICAL DATA:  Worsening cough EXAM:  CHEST - 2 VIEW COMPARISON:  March 05, 2019 FINDINGS: Healed right rib fractures. The heart, hila, mediastinum, lungs, and pleura are normal. IMPRESSION: No active cardiopulmonary disease. Electronically Signed   By: Gerome Sam III M.D.   On: 09/16/2021 17:57   ? ?Procedures ?Procedures (including critical care time) ? ?Medications Ordered in UC ?Medications  ?albuterol (VENTOLIN HFA) 108 (90 Base) MCG/ACT inhaler 2 puff (2 puffs Inhalation Given 09/16/21 1743)  ? ? ?Initial Impression / Assessment and Plan / UC Course  ?I have reviewed the triage vital signs and the nursing notes. ? ?Pertinent labs & imaging results that were available during my care of the patient were reviewed by me and considered in my medical decision making (see chart for details). ? ?  ? ?X-ray obtained given showed no acute cardiopulmonary disease.  Patient was given albuterol with improvement of symptoms.  Suspect undiagnosed COPD that is currently flared causing symptoms.  Patient was sent home with albuterol with instruction use every 4-6 hours as needed.  We will start prednisone burst with instruction not to take NSAIDs.  Can use Mucinex, Tylenol, Flonase for symptom relief.  We will start doxycycline.  He was instructed to avoid prolonged sun exposure due to photosensitivity associated with this medication.  Recommended follow-up with primary care; he does not currently have a PCP so we will try to establish him with 1 via PCP assistance.  Discussed if he has any worsening symptoms he is to return for reevaluation.  Strict return precautions given to which he  expressed understanding. ? ?Blood pressure is elevated today.  Suspect this is related to NSAID use.  He was instructed to avoid NSAIDs, caffeine, sodium, decongestants.  Recommend he monitor his blood pressur

## 2021-09-16 NOTE — Discharge Instructions (Addendum)
Your x-ray was normal.  Use albuterol every 4-6 hours as needed.  Start prednisone burst (40 mg for 5 days).  Do not take NSAIDs including aspirin, ibuprofen/Advil, naproxen/Aleve with this medication.  Take doxycycline 100 mg twice daily for 10 days.  Stay out of the sun while on this medicine.  Use Mucinex, Flonase, Tylenol for symptom relief.  Make sure you rest and drink plenty of fluids.  If anything worsens go to the emergency room. ? ?Your blood pressure is elevated.  I believe this is because you are using naproxen.  Please avoid NSAIDs including aspirin, ibuprofen/Advil, naproxen/Aleve.  Avoid decongestants, caffeine, sodium.  Make sure you rest and drink plenty of fluid.  Monitor your blood pressure at home and if persistently above 140/90 please return for reevaluation.  We will try to establish you with a primary care provider.  If develop any chest pain, shortness of breath, headache, dizziness, nausea, vomiting you need to be seen immediately. ?

## 2021-09-16 NOTE — ED Triage Notes (Signed)
Pt states that he has had a cough with small amount whitish-yellow sputum. Pt states he has had shortness of breath. Pt reports that he has had 2 near syncopal episodes after having a coughing episode. Respirations even and no labored. Pt is speaking in full sentences. ?

## 2021-09-18 ENCOUNTER — Encounter (HOSPITAL_COMMUNITY): Payer: Self-pay

## 2022-04-03 ENCOUNTER — Ambulatory Visit (INDEPENDENT_AMBULATORY_CARE_PROVIDER_SITE_OTHER): Payer: Medicare Other

## 2022-04-03 ENCOUNTER — Other Ambulatory Visit: Payer: Self-pay | Admitting: Physician Assistant

## 2022-04-03 ENCOUNTER — Ambulatory Visit (INDEPENDENT_AMBULATORY_CARE_PROVIDER_SITE_OTHER): Payer: Medicare Other | Admitting: Orthopaedic Surgery

## 2022-04-03 ENCOUNTER — Encounter: Payer: Self-pay | Admitting: Orthopaedic Surgery

## 2022-04-03 DIAGNOSIS — M5441 Lumbago with sciatica, right side: Secondary | ICD-10-CM | POA: Diagnosis not present

## 2022-04-03 DIAGNOSIS — M25551 Pain in right hip: Secondary | ICD-10-CM | POA: Diagnosis not present

## 2022-04-03 MED ORDER — TRAMADOL HCL 50 MG PO TABS: 50.0000 mg | ORAL_TABLET | Freq: Four times a day (QID) | ORAL | 0 refills | Status: DC | PRN

## 2022-04-03 NOTE — Addendum Note (Signed)
Addended by: Polly Cobia on: 04/03/2022 10:02 AM   Modules accepted: Orders

## 2022-04-03 NOTE — Progress Notes (Signed)
Office Visit Note   Patient: Garrett Brown.           Date of Birth: 1951/03/03           MRN: [id number] Visit Date: 04/03/2022              Requested by: Lavada Mesi, MD [address] [place],  [place] [address] PCP: Lavada Mesi, MD   Assessment & Plan: Visit Diagnoses:  1. Midline low back pain with right-sided sciatica, unspecified chronicity   2. Pain in right hip     Plan: Garrett Brown has been experiencing pain along the lateral aspect of his right hip associated with some burning along the anterior thigh.  He does not experience true radicular pain.  He also has had some back pain.  He is recently had some trouble sleeping.  He denies groin pain.  He is just over 3 years status post primary hip replacement followed within  30 days  with a right femur fracture after a fall at work.  He was treated at [facility] with open reduction internal fixation is done well from that standpoint.  He is now retired.  He relates he is not very active with exercise he has had some trouble sleeping.  X-rays of his hip and femur were fine without evidence of any complications related to the prosthesis.  His fracture is healed.  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  I like to try a course of physical therapy.  Will try some tramadol on a short-term basis to help him sleep.  If he is not any better within the next 30  days I would like to obtain an MRI scan.  Follow-Up Instructions: Return if symptoms worsen or fail to improve.   Orders:  Orders Placed This Encounter  Procedures   XR Lumbar Spine 2-3 Views   XR Pelvis 1-2 Views   No orders of the defined types were placed in this encounter.     Procedures: No procedures performed   Clinical Data: No additional findings.   Subjective: Chief Complaint  Patient presents with   Right Hip - Pain   Lower Back - Pain  Garrett Brown has been experiencing pain  along the lateral aspect of his right hip and thigh with some burning along the anterior thigh for a "while".  He denies any history of injury or trauma.  He is over 3 years status post primary right total hip replacement followed within 30 days with a Workmen's Comp. injury fracturing his femur.  Treatment was performed at [facility] with open reduction internal fixation and is done well.  He notes he does not limp.  He has had some trouble with his back for a long time.  HPI  Review of Systems   Objective: Vital Signs: There were no vitals taken for this visit.  Physical Exam Constitutional:      Appearance: He is well-developed.  Eyes:     Pupils: Pupils are equal, round, and reactive to light.  Pulmonary:     Effort: Pulmonary effort is normal.  Skin:    General: Skin is warm and dry.  Neurological:     Mental Status: He is alert and oriented to person, place, and time.  Psychiatric:        Behavior: Behavior normal.     Ortho Exam awake alert and oriented x 3.  Comfortable sitting.  Walks without  a limp.  Straight leg raise negative.  Painless range of motion of right hip.  Range of motion of both hips are symmetrical.  There is some mild tenderness along the lateral thigh in line with his femoral plate and screws.  No swelling.  No significant areas of tenderness.  He does have some mild percussible tenderness of his lumbar spine.  Neurologically appears to be intact.  No distal edema.  Specialty Comments:  No specialty comments available.  Imaging: XR Pelvis 1-2 Views  Result Date: 04/03/2022 AP pelvis demonstrates intact bilateral total hip replacements.  Patient has had open reduction internal fixation of a right femur fracture performed about 30 days after right hip replacement about 3 years ago.  The fracture is healed and the plate is in good position.  There is a little ectopic calcification proximal to the greater trochanter which could be a source of pain but no other  abnormalities no evidence of poly wear  XR Lumbar Spine 2-3 Views  Result Date: 04/03/2022 Films of the lumbar spine were obtained in 2 projections.  There is no evidence of a listhesis or scoliosis.  There is diffuse degenerative disc disease with some narrowing of the disc spaces throughout the lumbar spine and large anterior osteophytes.  There is facet sclerosis between L3 and L5-S1.  Diffuse calcification of the abdominal aorta without obvious visible dilatation.  No acute changes films are consistent with diffuse degenerative arthritis    PMFS History: Patient Active Problem List   Diagnosis Date Noted   Pain in right hip 04/03/2022   Calcification of soft tissue 04/03/2020   Osteoarthritis of right hip 03/09/2019   Primary osteoarthritis of right hip 02/23/2019   Tobacco abuse 02/12/2019   Status post total hip replacement, bilateral 02/21/2015   Past Medical History:  Diagnosis Date   Arthritis    Constipation due to pain medication     History reviewed. No pertinent family history.  Past Surgical History:  Procedure Laterality Date   COLONOSCOPY W/ POLYPECTOMY     TOTAL HIP ARTHROPLASTY Left 02/21/2015   TOTAL HIP ARTHROPLASTY Left 02/21/2015   Procedure: TOTAL LEFT HIP ARTHROPLASTY;  Surgeon: Valeria Batman, MD;  Location: [facility];  Service: Orthopedics;  Laterality: Left;   TOTAL HIP ARTHROPLASTY Right 03/09/2019   Procedure: RIGHT TOTAL HIP ARTHROPLASTY;  Surgeon: Valeria Batman, MD;  Location: [facility];  Service: Orthopedics;  Laterality: Right;   VARICOCELE EXCISION  1989   Social History   Occupational History   Not on file  Tobacco Use   Smoking status: Every Day    Packs/day: 1.00    Years: 20.00    Total pack years: 20.00    Types: Cigarettes   Smokeless tobacco: Never  Vaping Use   Vaping Use: Never used  Substance and Sexual Activity   Alcohol use: Yes    Comment: social   Drug use: Yes    Frequency: 3.0 times per week    Types: Marijuana    Sexual activity: Not on file     Valeria Batman, MD   Note - This record has been created using [employer].  Chart creation errors have been sought, but may not always  have been located. Such creation errors do not reflect on  the standard of medical care.

## 2023-05-22 ENCOUNTER — Encounter (HOSPITAL_COMMUNITY): Payer: Self-pay | Admitting: Emergency Medicine

## 2023-05-22 ENCOUNTER — Ambulatory Visit (HOSPITAL_COMMUNITY)
Admission: EM | Admit: 2023-05-22 | Discharge: 2023-05-22 | Disposition: A | Discharge status: Home / Self Care | Payer: Medicare Other | Attending: Internal Medicine | Admitting: Internal Medicine

## 2023-05-22 ENCOUNTER — Ambulatory Visit (INDEPENDENT_AMBULATORY_CARE_PROVIDER_SITE_OTHER): Payer: Medicare Other

## 2023-05-22 DIAGNOSIS — J441 Chronic obstructive pulmonary disease with (acute) exacerbation: Secondary | ICD-10-CM

## 2023-05-22 DIAGNOSIS — J42 Unspecified chronic bronchitis: Secondary | ICD-10-CM

## 2023-05-22 DIAGNOSIS — J209 Acute bronchitis, unspecified: Secondary | ICD-10-CM | POA: Diagnosis not present

## 2023-05-22 MED ORDER — BENZONATATE 100 MG PO CAPS: 100.0000 mg | ORAL_CAPSULE | Freq: Three times a day (TID) | ORAL | 0 refills | Status: DC | PRN

## 2023-05-22 MED ORDER — DOXYCYCLINE HYCLATE 100 MG PO CAPS: 100.0000 mg | ORAL_CAPSULE | Freq: Two times a day (BID) | ORAL | 0 refills | Status: AC

## 2023-05-22 MED ORDER — ALBUTEROL SULFATE HFA 108 (90 BASE) MCG/ACT IN AERS: 1.0000 | INHALATION_SPRAY | Freq: Four times a day (QID) | RESPIRATORY_TRACT | 0 refills | Status: DC | PRN

## 2023-05-22 MED ORDER — PREDNISONE 20 MG PO TABS: 40.0000 mg | ORAL_TABLET | Freq: Every day | ORAL | 0 refills | Status: AC

## 2023-05-22 NOTE — Discharge Instructions (Addendum)
 Chest x-ray is negative for pneumonia Please take medications as directed Please increase oral fluid intake If you have worsening shortness of breath, fever, persistent chest tightness or confusion please go to the emergency room to be evaluated.

## 2023-05-22 NOTE — ED Triage Notes (Signed)
 Pt reports cough and SOB for about 3 days. Used inhaler little bit, but year or so old. Also tried Theraflu and Ibuprofen.

## 2023-05-22 NOTE — ED Provider Notes (Signed)
 [facility]    CSN: [id number] Arrival date & time: 05/22/23  1521      History   Chief Complaint Chief Complaint  Patient presents with   Cough    HPI Garrett Brown. is a 73 y.o. male with a history of chronic tobacco use currently on albuterol  inhaler comes to urgent care with complaints of cough, wheezing and shortness of breath of 3 days duration.  Patient's symptoms started with nasal congestion, sore throat and generalized bodyaches.  Symptoms have improved except when cough, wheezing and shortness of breath.  He denies any sputum production.  He has had some chills but no fever.  No nausea, vomiting or diarrhea.  Patient denies any sick contacts.  He has been using his inhaler to help help with breathing.  Patient has provide 20-pack-year history of smoking cigarettes.  HPI  Past Medical History:  Diagnosis Date   Arthritis    Constipation due to pain medication     Patient Active Problem List   Diagnosis Date Noted   Pain in right hip 04/03/2022   Calcification of soft tissue 04/03/2020   Osteoarthritis of right hip 03/09/2019   Primary osteoarthritis of right hip 02/23/2019   Tobacco abuse 02/12/2019   Status post total hip replacement, bilateral 02/21/2015    Past Surgical History:  Procedure Laterality Date   COLONOSCOPY W/ POLYPECTOMY     FRACTURE SURGERY     TOTAL HIP ARTHROPLASTY Left 02/21/2015   TOTAL HIP ARTHROPLASTY Left 02/21/2015   Procedure: TOTAL LEFT HIP ARTHROPLASTY;  Surgeon: Maude LELON Right, MD;  Location: [facility] OR;  Service: Orthopedics;  Laterality: Left;   TOTAL HIP ARTHROPLASTY Right 03/09/2019   Procedure: RIGHT TOTAL HIP ARTHROPLASTY;  Surgeon: Right Maude LELON, MD;  Location: [facility];  Service: Orthopedics;  Laterality: Right;   VARICOCELE EXCISION  05/14/1987       Home Medications    Prior to Admission medications   Medication Sig Start Date End Date Taking? Authorizing Provider  albuterol  (VENTOLIN  HFA) 108 (90  Base) MCG/ACT inhaler Inhale 1-2 puffs into the lungs every 6 (six) hours as needed for wheezing or shortness of breath. 05/22/23  Yes Drevion Offord, Aleene KIDD, MD  benzonatate  (TESSALON ) 100 MG capsule Take 1 capsule (100 mg total) by mouth 3 (three) times daily as needed for cough. 05/22/23  Yes Layce Sprung, Aleene KIDD, MD  doxycycline  (VIBRAMYCIN ) 100 MG capsule Take 1 capsule (100 mg total) by mouth 2 (two) times daily for 7 days. 05/22/23 05/29/23 Yes Valente Fosberg, Aleene KIDD, MD  predniSONE  (DELTASONE ) 20 MG tablet Take 2 tablets (40 mg total) by mouth daily for 5 days. 05/22/23 05/27/23 Yes Kaimana Lurz, Aleene KIDD, MD    Family History No family history on file.  Social History Social History   Tobacco Use   Smoking status: Every Day    Current packs/day: 1.00    Average packs/day: 1 pack/day for 20.0 years (20.0 ttl pk-yrs)    Types: Cigarettes   Smokeless tobacco: Never  Vaping Use   Vaping status: Never Used  Substance Use Topics   Alcohol use: Yes    Comment: social   Drug use: Yes    Frequency: 3.0 times per week    Types: Marijuana     Allergies   Patient has no known allergies.   Review of Systems Review of Systems As per HPI  Physical Exam Triage Vital Signs ED Triage Vitals  Encounter Vitals Group     BP 05/22/23 1534 ROLLEN)  146/88     Systolic BP Percentile --      Diastolic BP Percentile --      Pulse Rate 05/22/23 1534 83     Resp 05/22/23 1534 20     Temp 05/22/23 1534 (!) 97.4 F (36.3 C)     Temp Source 05/22/23 1534 Oral     SpO2 05/22/23 1534 96 %     Weight --      Height --      Head Circumference --      Peak Flow --      Pain Score 05/22/23 1533 0     Pain Loc --      Pain Education --      Exclude from Growth Chart --    No data found.  Updated Vital Signs BP (!) 146/88 (BP Location: Right Arm)   Pulse 83   Temp (!) 97.4 F (36.3 C) (Oral)   Resp 20   SpO2 96%   Visual Acuity Right Eye Distance:   Left Eye Distance:   Bilateral Distance:    Right Eye  Near:   Left Eye Near:    Bilateral Near:     Physical Exam Vitals and nursing note reviewed.  Constitutional:      General: He is not in acute distress.    Appearance: He is not ill-appearing.  Cardiovascular:     Rate and Rhythm: Normal rate and regular rhythm.     Pulses: Normal pulses.     Heart sounds: Normal heart sounds.  Pulmonary:     Effort: Pulmonary effort is normal.     Breath sounds: Normal breath sounds. No wheezing or rhonchi.  Abdominal:     General: Abdomen is flat. Bowel sounds are normal.     Palpations: Abdomen is soft.  Neurological:     Mental Status: He is alert.      UC Treatments / Results  Labs (all labs ordered are listed, but only abnormal results are displayed) Labs Reviewed - No data to display  EKG   Radiology DG Chest 2 View Result Date: 05/22/2023 CLINICAL DATA:  Cough and shortness of breath EXAM: CHEST - 2 VIEW COMPARISON:  09/17/2018 FINDINGS: Cardiac shadow is stable. Aortic calcifications are seen. The lungs are hyperinflated bilaterally. No focal infiltrate or effusion is noted. IMPRESSION: Hyperinflation without acute abnormality. Electronically Signed   By: Oneil Devonshire M.D.   On: 05/22/2023 17:17    Procedures Procedures (including critical care time)  Medications Ordered in UC Medications - No data to display  Initial Impression / Assessment and Plan / UC Course  I have reviewed the triage vital signs and the nursing notes.  Pertinent labs & imaging results that were available during my care of the patient were reviewed by me and considered in my medical decision making (see chart for details).     1.  COPD with acute exacerbation: Chest x-ray is negative for acute lung infiltrates Albuterol  inhaler 4 to 6 hours as needed for wheezing Prednisone  40 mg orally daily for 5 days Doxycycline  100 mg orally twice daily for 7 days Return precautions given. Final Clinical Impressions(s) / UC Diagnoses   Final diagnoses:   COPD with acute exacerbation [facility])     Discharge Instructions      Chest x-ray is negative for pneumonia Please take medications as directed Please increase oral fluid intake If you have worsening shortness of breath, fever, persistent chest tightness or confusion please go to the emergency  room to be evaluated.     ED Prescriptions     Medication Sig Dispense Auth. Provider   albuterol  (VENTOLIN  HFA) 108 (90 Base) MCG/ACT inhaler Inhale 1-2 puffs into the lungs every 6 (six) hours as needed for wheezing or shortness of breath. 6.7 g Reno Clasby, Aleene KIDD, MD   doxycycline  (VIBRAMYCIN ) 100 MG capsule Take 1 capsule (100 mg total) by mouth 2 (two) times daily for 7 days. 14 capsule Verlena Marlette, Aleene KIDD, MD   predniSONE  (DELTASONE ) 20 MG tablet Take 2 tablets (40 mg total) by mouth daily for 5 days. 10 tablet Salote Weidmann, Aleene KIDD, MD   benzonatate  (TESSALON ) 100 MG capsule Take 1 capsule (100 mg total) by mouth 3 (three) times daily as needed for cough. 21 capsule Kellina Dreese, Aleene KIDD, MD      PDMP not reviewed this encounter.   Blaise Aleene KIDD, MD 05/22/23 1726

## 2023-08-27 ENCOUNTER — Encounter (HOSPITAL_BASED_OUTPATIENT_CLINIC_OR_DEPARTMENT_OTHER): Payer: Self-pay | Admitting: Family Medicine

## 2023-08-27 ENCOUNTER — Ambulatory Visit (HOSPITAL_BASED_OUTPATIENT_CLINIC_OR_DEPARTMENT_OTHER): Admitting: Family Medicine

## 2023-08-27 VITALS — BP 140/93 | HR 73 | Ht 70.0 in | Wt 217.1 lb

## 2023-08-27 DIAGNOSIS — Z0000 Encounter for general adult medical examination without abnormal findings: Secondary | ICD-10-CM

## 2023-08-27 DIAGNOSIS — F17200 Nicotine dependence, unspecified, uncomplicated: Secondary | ICD-10-CM

## 2023-08-27 DIAGNOSIS — R21 Rash and other nonspecific skin eruption: Secondary | ICD-10-CM | POA: Diagnosis not present

## 2023-08-27 DIAGNOSIS — R030 Elevated blood-pressure reading, without diagnosis of hypertension: Secondary | ICD-10-CM | POA: Diagnosis not present

## 2023-08-27 DIAGNOSIS — L821 Other seborrheic keratosis: Secondary | ICD-10-CM

## 2023-08-27 DIAGNOSIS — Z1211 Encounter for screening for malignant neoplasm of colon: Secondary | ICD-10-CM | POA: Diagnosis not present

## 2023-08-27 NOTE — Assessment & Plan Note (Signed)
 Rash over chest and back appears consistent with resolving shingles.  At present, patient has had notable improvement in symptoms and lesions are currently crusted over.  Given this, do not feel that there would be any role for antiviral treatment at this time.  Discussed that in some cases, symptoms may persist including continued pain even once lesions are resolved.  Discussed that if this does occur, there are treatment options available and for patient to let us  know and we can recommend specific treatment measures.  Would also recommend for patient to proceed with shingles vaccination.

## 2023-08-27 NOTE — Assessment & Plan Note (Signed)
 Areas of concern over back appear consistent with seborrheic keratoses.  Discussed benign nature of these conditions.  He would like to establish with dermatologist for evaluation of this as well as for general skin cancer screening, feel that this is reasonable, referral placed.

## 2023-08-27 NOTE — Assessment & Plan Note (Signed)
 Blood pressure elevated in office today, did improve slightly on recheck.  Recommend monitoring blood pressure moving forward.  Recommend remote monitoring of blood pressure at home, DASH diet

## 2023-08-27 NOTE — Progress Notes (Signed)
 New Patient Office Visit  Subjective   Patient ID: Garrett Parady., male    DOB: August 25, 1950  Age: 73 y.o. MRN: [id number]  CC:  Chief Complaint  Patient presents with   New Patient (Initial Visit)    New patient has rash on chest and back has black spots on back would like referral to dermatology     HPI Garrett Brown. presents to establish care Last PCP - Dr. Armandina Bernard, this was brief.  Rash: noted on chest, this started about 2 weeks ago, mostly improved at this point. This has been painful, no itching, no oozing or drainage. Also has some spots on his back that he is concerned about. These have been present for a long time for patient.  Wants to get physical set up.  Did have ED visit earlier this year. Was diagnosed with COPD exacerbation at that time. Does still get intermittent cough. Still smokes, interested in quitting. Has tried gum in the past. No other interventions.  Patient is originally from [place], [place], moved to [place] in [place] as his father was in [employer]. 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 He does want to exercise more. Activities limited due to right hip replacement.  Outpatient Encounter Medications as of 08/27/2023  Medication Sig   albuterol  (VENTOLIN  HFA) 108 (90 Base) MCG/ACT inhaler Inhale 1-2 puffs into the lungs every 6 (six) hours as needed for wheezing or shortness of breath.   [DISCONTINUED] benzonatate  (TESSALON ) 100 MG capsule Take 1 capsule (100 mg total) by mouth 3 (three) times daily as needed for cough. (Patient not taking: Reported on 08/27/2023)   No facility-administered encounter medications on file as of 08/27/2023.    Past Medical History:  Diagnosis Date   Arthritis    Constipation due to pain medication     Past Surgical History:  Procedure Laterality Date   COLONOSCOPY W/ POLYPECTOMY     FRACTURE SURGERY     TOTAL HIP ARTHROPLASTY Left 02/21/2015   TOTAL HIP ARTHROPLASTY Left 02/21/2015    Procedure: TOTAL LEFT HIP ARTHROPLASTY;  Surgeon: Shirlee Dotter, MD;  Location: [facility];  Service: Orthopedics;  Laterality: Left;   TOTAL HIP ARTHROPLASTY Right 03/09/2019   Procedure: RIGHT TOTAL HIP ARTHROPLASTY;  Surgeon: Shirlee Dotter, MD;  Location: [facility];  Service: Orthopedics;  Laterality: Right;   VARICOCELE EXCISION  05/14/1987    History reviewed. No pertinent family history.  Social History   Socioeconomic History   Marital status: Divorced    Spouse name: Not on file   Number of children: Not on file   Years of education: Not on file   Highest education level: Not on file  Occupational History   Not on file  Tobacco Use   Smoking status: Every Day    Current packs/day: 1.00    Average packs/day: 1 pack/day for 20.0 years (20.0 ttl pk-yrs)    Types: Cigarettes    Passive exposure: Current   Smokeless tobacco: Never  Vaping Use   Vaping status: Never Used  Substance and Sexual Activity   Alcohol use: Yes    Comment: social   Drug use: Yes    Frequency: 3.0 times per week    Types: Marijuana   Sexual activity: Not on file  Other Topics Concern   Not on file  Social History Narrative   Not on file   Social Drivers of Health   Financial Resource Strain: Not on file  Food Insecurity: Not on file  Transportation Needs: Not on file  Physical Activity: Not on file  Stress: Not on file  Social Connections: Not on file  Intimate Partner Violence: Not on file    Objective   BP (!) 140/93 (BP Location: Left Arm, Patient Position: Sitting, Cuff Size: Normal)   Pulse 73   Ht 5\' 10"  (1.778 m)   Wt 217 lb 1.6 oz (98.5 kg)   SpO2 100%   BMI 31.15 kg/m   Physical Exam  73 year old male in no acute distress Cardiovascular exam with regular rate and rhythm Lungs with distant breath sounds, otherwise clear to auscultation bilaterally, no significant wheezing appreciated. Patient does have rash located over right side of central chest, also has  similar lesions over center of back within thoracic region, slightly also to right side.  There are areas of scabbing present over scattered erythematous base.  No active oozing or drainage at this time.  No notable swelling.  No tenderness to palpation. Also over back primarily, patient does have darkly pigmented areas with stuck on appearance.  No surrounding erythema, no oozing, drainage or bleeding present with these areas.  No tenderness to palpation.  Assessment & Plan:   Elevated blood pressure reading in office without diagnosis of hypertension Assessment & Plan: Blood pressure elevated in office today, did improve slightly on recheck.  Recommend monitoring blood pressure moving forward.  Recommend remote monitoring of blood pressure at home, DASH diet  Orders: -     CBC with Differential/Platelet; Future  Special screening for malignant neoplasms, colon -     Cologuard  Wellness examination -     CBC with Differential/Platelet; Future -     Comprehensive metabolic panel with GFR; Future -     Lipid panel; Future -     TSH Rfx on Abnormal to Free T4; Future  Rash Assessment & Plan: Rash over chest and back appears consistent with resolving shingles.  At present, patient has had notable improvement in symptoms and lesions are currently crusted over.  Given this, do not feel that there would be any role for antiviral treatment at this time.  Discussed that in some cases, symptoms may persist including continued pain even once lesions are resolved.  Discussed that if this does occur, there are treatment options available and for patient to let us  know and we can recommend specific treatment measures.  Would also recommend for patient to proceed with shingles vaccination.  Orders: -     CBC with Differential/Platelet; Future  Seborrheic keratosis Assessment & Plan: Areas of concern over back appear consistent with seborrheic keratoses.  Discussed benign nature of these conditions.   He would like to establish with dermatologist for evaluation of this as well as for general skin cancer screening, feel that this is reasonable, referral placed.   Tobacco use disorder  Return in about 6 weeks (around 10/08/2023) for CPE with fasting labs 1 week prior.    ___________________________________________ Keola Heninger de [place], MD, ABFM, CAQSM Primary Care and Sports Medicine [facility]

## 2023-08-27 NOTE — Patient Instructions (Signed)
  Medication Instructions:  Your physician recommends that you continue on your current medications as directed. Please refer to the Current Medication list given to you today. --If you need a refill on any your medications before your next appointment, please call your pharmacy first. If no refills are authorized on file call the office.-- Lab Work: Your physician has recommended that you have lab work today: 1 week before next visit  If you have labs (blood work) drawn today and your tests are completely normal, you will receive your results via MyChart message OR a phone call from our staff.  Please ensure you check your voicemail in the event that you authorized detailed messages to be left on a delegated number. If you have any lab test that is abnormal or we need to change your treatment, we will call you to review the results.    Follow-Up: Your next appointment:   Your physician recommends that you schedule a follow-up appointment in: 1-2 month physical with Dr. de Peru  You will receive a text message or e-mail with a link to a survey about your care and experience with [place] today! We would greatly appreciate your feedback!   Thanks for letting us be apart of your health journey!!  Primary Care and Sports Medicine   Dr. Ceasar Mons Peru   We encourage you to activate your patient portal called "MyChart".  Sign up information is provided on this After Visit Summary.  MyChart is used to connect with patients for Virtual Visits (Telemedicine).  Patients are able to view lab/test results, encounter notes, upcoming appointments, etc.  Non-urgent messages can be sent to your provider as well. To learn more about what you can do with MyChart, please visit --  [email or web address].

## 2023-09-27 ENCOUNTER — Emergency Department (HOSPITAL_BASED_OUTPATIENT_CLINIC_OR_DEPARTMENT_OTHER)

## 2023-09-27 ENCOUNTER — Emergency Department (HOSPITAL_BASED_OUTPATIENT_CLINIC_OR_DEPARTMENT_OTHER)
Admission: EM | Admit: 2023-09-27 | Discharge: 2023-09-27 | Disposition: A | Discharge status: Home / Self Care | Attending: Emergency Medicine | Admitting: Emergency Medicine

## 2023-09-27 ENCOUNTER — Other Ambulatory Visit (HOSPITAL_COMMUNITY): Payer: Self-pay

## 2023-09-27 ENCOUNTER — Other Ambulatory Visit (HOSPITAL_BASED_OUTPATIENT_CLINIC_OR_DEPARTMENT_OTHER): Payer: Self-pay

## 2023-09-27 ENCOUNTER — Encounter (HOSPITAL_BASED_OUTPATIENT_CLINIC_OR_DEPARTMENT_OTHER): Payer: Self-pay

## 2023-09-27 ENCOUNTER — Other Ambulatory Visit: Payer: Self-pay

## 2023-09-27 DIAGNOSIS — F17200 Nicotine dependence, unspecified, uncomplicated: Secondary | ICD-10-CM | POA: Diagnosis not present

## 2023-09-27 DIAGNOSIS — I159 Secondary hypertension, unspecified: Secondary | ICD-10-CM | POA: Insufficient documentation

## 2023-09-27 DIAGNOSIS — R42 Dizziness and giddiness: Secondary | ICD-10-CM

## 2023-09-27 DIAGNOSIS — I493 Ventricular premature depolarization: Secondary | ICD-10-CM | POA: Diagnosis not present

## 2023-09-27 LAB — CBC WITH DIFFERENTIAL/PLATELET
Abs Immature Granulocytes: 0.03 10*3/uL (ref 0.00–0.07)
Basophils Absolute: 0.1 10*3/uL (ref 0.0–0.1)
Basophils Relative: 1 %
Eosinophils Absolute: 0.2 10*3/uL (ref 0.0–0.5)
Eosinophils Relative: 3 %
HCT: 42.4 % (ref 39.0–52.0)
Hemoglobin: 14.4 g/dL (ref 13.0–17.0)
Immature Granulocytes: 0 %
Lymphocytes Relative: 19 %
Lymphs Abs: 1.6 10*3/uL (ref 0.7–4.0)
MCH: 30 pg (ref 26.0–34.0)
MCHC: 34 g/dL (ref 30.0–36.0)
MCV: 88.3 fL (ref 80.0–100.0)
Monocytes Absolute: 0.6 10*3/uL (ref 0.1–1.0)
Monocytes Relative: 8 %
Neutro Abs: 5.8 10*3/uL (ref 1.7–7.7)
Neutrophils Relative %: 69 %
Platelets: 226 10*3/uL (ref 150–400)
RBC: 4.8 MIL/uL (ref 4.22–5.81)
RDW: 14.2 % (ref 11.5–15.5)
WBC: 8.3 10*3/uL (ref 4.0–10.5)
nRBC: 0 % (ref 0.0–0.2)

## 2023-09-27 LAB — COMPREHENSIVE METABOLIC PANEL WITH GFR
ALT: 21 U/L (ref 0–44)
AST: 20 U/L (ref 15–41)
Albumin: 4.3 g/dL (ref 3.5–5.0)
Alkaline Phosphatase: 55 U/L (ref 38–126)
Anion gap: 13 (ref 5–15)
BUN: 18 mg/dL (ref 8–23)
CO2: 23 mmol/L (ref 22–32)
Calcium: 9.5 mg/dL (ref 8.9–10.3)
Chloride: 102 mmol/L (ref 98–111)
Creatinine, Ser: 1.02 mg/dL (ref 0.61–1.24)
GFR, Estimated: >60 mL/min (ref >60)
Glucose, Bld: 161 mg/dL — ABNORMAL HIGH (ref 70–99)
Potassium: 3.9 mmol/L (ref 3.5–5.1)
Sodium: 138 mmol/L (ref 135–145)
Total Bilirubin: 0.4 mg/dL (ref 0.0–1.2)
Total Protein: 6.8 g/dL (ref 6.5–8.1)

## 2023-09-27 LAB — TROPONIN T, HIGH SENSITIVITY: Troponin T High Sensitivity: 16 ng/L (ref <19)

## 2023-09-27 LAB — LIPASE, BLOOD: Lipase: 16 U/L (ref 11–51)

## 2023-09-27 MED ORDER — AMLODIPINE BESYLATE 5 MG PO TABS
5.0000 mg | ORAL_TABLET | Freq: Every day | ORAL | 1 refills | Status: DC
  Filled 2023-09-27: qty 30, 30d supply, fill #0

## 2023-09-27 MED ORDER — AMLODIPINE BESYLATE 5 MG PO TABS
5.0000 mg | ORAL_TABLET | Freq: Once | ORAL | Status: AC
  Administered 2023-09-27: 5 mg via ORAL
  Filled 2023-09-27: qty 1

## 2023-09-27 NOTE — Discharge Instructions (Addendum)
 I have started you on a blood pressure medication called Norvasc.  I gave you your first dose prior to leaving today.  Please pick up your prescription and start the medication tomorrow morning.  Try to adhere to a low-salt diet.  Stay hydrated by drinking more water .  The lab tests that were ordered by your primary care physician outpatient is: CBC, BMP, TSH, and lipid panel.  We completed your CBC and BMP today and they were within normal limits.

## 2023-09-27 NOTE — ED Triage Notes (Signed)
 In for eval of lightheadedness this morning. Woke up feeling "weird". Elevated BP.  Took Tylenol  100 mg and Emergen-C this am. Denies headache, blurred vision, MAEW, or epistaxis.

## 2023-09-27 NOTE — ED Notes (Signed)
 Discharge paperwork given and verbally understood.

## 2023-09-27 NOTE — ED Provider Notes (Signed)
 [place] EMERGENCY DEPARTMENT AT [facility] Provider Note   CSN: [id number] Arrival date & time: 09/27/23  7829     History  Chief Complaint  Patient presents with   Dizziness    Garrett Brown. is a 73 y.o. male.  Patient is a 73 year old male, current smoker, presenting with complaints of dizziness.  Patient states he woke this morning with dizziness described as lightheadedness.  He denies any neurological dysfunction including no gait abnormalities, no room spinning sensation, no changes in speech or confusion, no sensation or motor deficits.  He states symptoms are worse while standing.  He denies any chest pain, abdominal pain, back pain.  He denies any shortness of breath.  Of note, patient's blood pressure on arrival was 174/108.  He states he does not typically follow with a primary care physician.   He drinks 3-4 [employer] a day.  Daily smoker with a 20-pack-year history.  Does not take any antihypertensives.  Had spaghetti sauce last night.  Denies vision changes, headache, tinnitus, chest tightness, or leg swelling.  The history is provided by the patient. No language interpreter was used.  Dizziness Associated symptoms: no chest pain, no palpitations, no shortness of breath and no vomiting        Home Medications Prior to Admission medications   Medication Sig Start Date End Date Taking? Authorizing Provider  albuterol  (VENTOLIN  HFA) 108 (90 Base) MCG/ACT inhaler Inhale 1-2 puffs into the lungs every 6 (six) hours as needed for wheezing or shortness of breath. 05/22/23  Yes Lamptey, Donley Furth, MD      Allergies    Patient has no known allergies.    Review of Systems   Review of Systems  Constitutional:  Negative for chills and fever.  HENT:  Negative for ear pain and sore throat.   Eyes:  Negative for pain and visual disturbance.  Respiratory:  Negative for cough and shortness of breath.   Cardiovascular:  Negative for chest pain and  palpitations.  Gastrointestinal:  Negative for abdominal pain and vomiting.  Genitourinary:  Negative for dysuria and hematuria.  Musculoskeletal:  Negative for arthralgias and back pain.  Skin:  Negative for color change and rash.  Neurological:  Positive for dizziness. Negative for seizures and syncope.  All other systems reviewed and are negative.   Physical Exam Updated Vital Signs BP (!) 140/103 (BP Location: Right Arm)   Pulse 65   Temp 97.6 F (36.4 C) (Oral)   Resp (!) 22   Ht 5\' 10"  (1.778 m)   Wt 94.8 kg   SpO2 98%   BMI 29.99 kg/m  Physical Exam Vitals and nursing note reviewed.  Constitutional:      General: He is not in acute distress.    Appearance: He is well-developed.  HENT:     Head: Normocephalic and atraumatic.  Eyes:     Conjunctiva/sclera: Conjunctivae normal.  Cardiovascular:     Rate and Rhythm: Normal rate and regular rhythm.     Heart sounds: No murmur heard. Pulmonary:     Effort: Pulmonary effort is normal. No respiratory distress.     Breath sounds: Normal breath sounds.  Abdominal:     Palpations: Abdomen is soft.     Tenderness: There is no abdominal tenderness.  Musculoskeletal:        General: No swelling.     Cervical back: Neck supple.  Skin:    General: Skin is warm and dry.     Capillary Refill:  Capillary refill takes less than 2 seconds.  Neurological:     General: No focal deficit present.     Mental Status: He is alert and oriented to person, place, and time.     GCS: GCS eye subscore is 4. GCS verbal subscore is 5. GCS motor subscore is 6.     Cranial Nerves: Cranial nerves 2-12 are intact.     Sensory: Sensation is intact.     Motor: Motor function is intact.     Coordination: Coordination is intact.  Psychiatric:        Mood and Affect: Mood normal.     ED Results / Procedures / Treatments   Labs (all labs ordered are listed, but only abnormal results are displayed) Labs Reviewed  COMPREHENSIVE METABOLIC PANEL  WITH GFR - Abnormal; Notable for the following components:      Result Value   Glucose, Bld 161 (*)    All other components within normal limits  CBC WITH DIFFERENTIAL/PLATELET  LIPASE, BLOOD  TROPONIN T, HIGH SENSITIVITY    EKG None  Radiology DG Chest Portable 1 View Result Date: 09/27/2023 CLINICAL DATA:  Dizziness lightheadedness this morning. Woke up feeling "weird". Elevated BP. EXAM: PORTABLE CHEST 1 VIEW COMPARISON:  Chest x-ray 05/22/2023, chest x-ray 09/16/2021 FINDINGS: The heart and mediastinal contours are within normal limits. No focal consolidation. No pulmonary edema. No pleural effusion. No pneumothorax. No acute osseous abnormality. IMPRESSION: No active disease. Electronically Signed   By: Morgane  Naveau M.D.   On: 09/27/2023 09:48    Procedures Procedures    Medications Ordered in ED Medications - No data to display  ED Course/ Medical Decision Making/ A&P                                 Medical Decision Making Amount and/or Complexity of Data Reviewed Labs: ordered. Radiology: ordered.   73 year old male, current smoker, presenting with complaints of dizziness.  Differential diagnosis includes poorly controlled hypertension, orthostatic hypotension, lower on differential includes but is not limited to CVA, ACS, AAA or dissection...  On exam, patient is alert oriented x 3, no acute distress, afebrile, stable vital signs.  Blood pressure elevated at 174/108 on arrival.  Otherwise stable vitals.  Is equal bilateral breath sounds with no adventitious lung sounds.  No cardiac murmurs.  No leg edema.  No neurovascular dysfunction.  Laboratory studies interpreted by myself demonstrate no acute abnormalities. Stable twelve-lead EKG demonstrating normal sinus rhythm with infrequent PVCs.  Stable troponin.  Stable electrolytes.  No AKI or laboratory findings suggesting for dehydration.  Chest x-ray stable.  No pneumothorax.  Normal mediastinum without  widening.  Reevaluation: at this time patient is very well-appearing with a current blood pressure of 140/103.  He was given his first dose of oral antihypertensives and a prescription was sent to the pharmacy.  I believe that his symptoms are secondary to poorly controlled hypertension.    Chart review demonstrates he was seen on 05/22/2023 at an urgent care for COPD exacerbation and was found to be hypertensive at 146/88.  I recommend close follow-up with his new established PCP-first appointment pending-in the next 2 to 4 weeks for repeat blood pressure check.  I also recommend he adhere to a low-sodium diet.  Patient has an established first appointment visit with his PCP on June 12.  I recommend blood pressure reevaluation at this time.  Symptoms also could be secondary to  PVCs.  Patient is having approximately 2 PVCs every 10 seconds.  I will recommend close cardiology follow-up if symptoms do not begin to improve with blood pressure control. ACS unlikely.  CVA unlikely.  Dissection unlikely.  Chart review demonstrates no CT in the last 10 years.  Needs follow-up with PCP and CT ordered for annual lung cancer screening due to 20-pack-year history.  Patient also needs ultrasound ordered for AAA screening.  Patient in no distress and overall condition improved here in the ED. Detailed discussions were had with the patient regarding current findings, and need for close f/u with PCP or on call doctor. The patient has been instructed to return immediately if the symptoms worsen in any way for re-evaluation. Patient verbalized understanding and is in agreement with current care plan. All questions answered prior to discharge.        Final Clinical Impression(s) / ED Diagnoses Final diagnoses:  None    Rx / DC Orders ED Discharge Orders     None         Quinn Bucco, DO 09/27/23 1052

## 2023-09-27 NOTE — ED Notes (Signed)
 Ortho V/S informed to Provider.Garrett AasAaron Brown

## 2023-09-28 LAB — CBG MONITORING, ED: Glucose-Capillary: 174 mg/dL — ABNORMAL HIGH (ref 70–99)

## 2023-10-13 ENCOUNTER — Other Ambulatory Visit (HOSPITAL_BASED_OUTPATIENT_CLINIC_OR_DEPARTMENT_OTHER): Payer: Self-pay | Admitting: *Deleted

## 2023-10-13 ENCOUNTER — Other Ambulatory Visit (HOSPITAL_COMMUNITY): Payer: Self-pay | Admitting: Family Medicine

## 2023-10-13 DIAGNOSIS — R21 Rash and other nonspecific skin eruption: Secondary | ICD-10-CM

## 2023-10-13 DIAGNOSIS — Z0000 Encounter for general adult medical examination without abnormal findings: Secondary | ICD-10-CM

## 2023-10-13 DIAGNOSIS — R030 Elevated blood-pressure reading, without diagnosis of hypertension: Secondary | ICD-10-CM

## 2023-10-14 LAB — CBC WITH DIFFERENTIAL/PLATELET
Basophils Absolute: 0.1 10*3/uL (ref 0.0–0.2)
Basos: 1 %
EOS (ABSOLUTE): 0.3 10*3/uL (ref 0.0–0.4)
Eos: 3 %
Hematocrit: 44.8 % (ref 37.5–51.0)
Hemoglobin: 15.2 g/dL (ref 13.0–17.7)
Immature Grans (Abs): 0 10*3/uL (ref 0.0–0.1)
Immature Granulocytes: 0 %
Lymphocytes Absolute: 2.3 10*3/uL (ref 0.7–3.1)
Lymphs: 30 %
MCH: 31 pg (ref 26.6–33.0)
MCHC: 33.9 g/dL (ref 31.5–35.7)
MCV: 91 fL (ref 79–97)
Monocytes Absolute: 0.7 10*3/uL (ref 0.1–0.9)
Monocytes: 9 %
Neutrophils Absolute: 4.3 10*3/uL (ref 1.4–7.0)
Neutrophils: 57 %
Platelets: 241 10*3/uL (ref 150–450)
RBC: 4.9 x10E6/uL (ref 4.14–5.80)
RDW: 13.4 % (ref 11.6–15.4)
WBC: 7.6 10*3/uL (ref 3.4–10.8)

## 2023-10-14 LAB — COMPREHENSIVE METABOLIC PANEL WITH GFR
ALT: 23 IU/L (ref 0–44)
AST: 20 IU/L (ref 0–40)
Albumin: 4.1 g/dL (ref 3.9–4.9)
Alkaline Phosphatase: 54 IU/L (ref 44–121)
BUN/Creatinine Ratio: 19 (ref 10–24)
BUN: 21 mg/dL (ref 8–27)
Bilirubin Total: 0.5 mg/dL (ref 0.0–1.2)
CO2: 22 mmol/L (ref 20–29)
Calcium: 9 mg/dL (ref 8.6–10.2)
Chloride: 102 mmol/L (ref 96–106)
Creatinine, Ser: 1.11 mg/dL (ref 0.76–1.27)
Globulin, Total: 2.3 g/dL (ref 1.5–4.5)
Glucose: 102 mg/dL — ABNORMAL HIGH (ref 70–99)
Potassium: 4.3 mmol/L (ref 3.5–5.2)
Sodium: 138 mmol/L (ref 134–144)
Total Protein: 6.4 g/dL (ref 6.0–8.5)
eGFR: 71 mL/min/{1.73_m2} (ref >59)

## 2023-10-14 LAB — LIPID PANEL
Chol/HDL Ratio: 2.7 ratio (ref 0.0–5.0)
Cholesterol, Total: 143 mg/dL (ref 100–199)
HDL: 53 mg/dL (ref >39)
LDL Chol Calc (NIH): 78 mg/dL (ref 0–99)
Triglycerides: 59 mg/dL (ref 0–149)
VLDL Cholesterol Cal: 12 mg/dL (ref 5–40)

## 2023-10-14 LAB — TSH RFX ON ABNORMAL TO FREE T4: TSH: 1.82 u[IU]/mL (ref 0.450–4.500)

## 2023-10-23 ENCOUNTER — Other Ambulatory Visit (HOSPITAL_BASED_OUTPATIENT_CLINIC_OR_DEPARTMENT_OTHER): Payer: Self-pay

## 2023-10-23 ENCOUNTER — Encounter (HOSPITAL_BASED_OUTPATIENT_CLINIC_OR_DEPARTMENT_OTHER): Payer: Self-pay | Admitting: Family Medicine

## 2023-10-23 ENCOUNTER — Ambulatory Visit (HOSPITAL_BASED_OUTPATIENT_CLINIC_OR_DEPARTMENT_OTHER): Admitting: Family Medicine

## 2023-10-23 VITALS — BP 108/78 | HR 59 | Ht 70.0 in | Wt 217.9 lb

## 2023-10-23 DIAGNOSIS — I10 Essential (primary) hypertension: Secondary | ICD-10-CM

## 2023-10-23 DIAGNOSIS — F17200 Nicotine dependence, unspecified, uncomplicated: Secondary | ICD-10-CM | POA: Diagnosis not present

## 2023-10-23 DIAGNOSIS — Z0000 Encounter for general adult medical examination without abnormal findings: Secondary | ICD-10-CM

## 2023-10-23 DIAGNOSIS — Z87891 Personal history of nicotine dependence: Secondary | ICD-10-CM

## 2023-10-23 MED ORDER — AMLODIPINE BESYLATE 5 MG PO TABS
5.0000 mg | ORAL_TABLET | Freq: Every day | ORAL | 1 refills | Status: DC
  Filled 2023-10-23: qty 90, 90d supply, fill #0
  Filled 2024-01-22: qty 90, 90d supply, fill #1

## 2023-10-23 NOTE — Patient Instructions (Signed)

## 2023-10-23 NOTE — Assessment & Plan Note (Signed)
 Routine HCM labs reviewed. HCM reviewed/discussed. Anticipatory guidance regarding healthy weight, lifestyle and choices given. Recommend healthy diet.  Recommend approximately 150 minutes/week of moderate intensity exercise Recommend regular dental and vision exams Always use seatbelt/lap and shoulder restraints Recommend using smoke alarms and checking batteries at least twice a year Recommend using sunscreen when outside Discussed colon cancer screening recommendations, options.  Patient will complete Cologuard Discussed immunization recommendations

## 2023-10-23 NOTE — Assessment & Plan Note (Signed)
 30 pack year history, current smoker

## 2023-10-23 NOTE — Progress Notes (Signed)
 Subjective:    CC: Annual Physical Exam  HPI:  Garrett Brown. is a 73 y.o. presenting for annual physical  I reviewed the past medical history, family history, social history, surgical history, and allergies today and no changes were needed.  Please see the problem list section below in epic for further details.  Past Medical History: Past Medical History:  Diagnosis Date   Arthritis    Constipation due to pain medication    Past Surgical History: Past Surgical History:  Procedure Laterality Date   COLONOSCOPY W/ POLYPECTOMY     FRACTURE SURGERY     TOTAL HIP ARTHROPLASTY Left 02/21/2015   TOTAL HIP ARTHROPLASTY Left 02/21/2015   Procedure: TOTAL LEFT HIP ARTHROPLASTY;  Surgeon: Shirlee Dotter, MD;  Location: [facility] OR;  Service: Orthopedics;  Laterality: Left;   TOTAL HIP ARTHROPLASTY Right 03/09/2019   Procedure: RIGHT TOTAL HIP ARTHROPLASTY;  Surgeon: Shirlee Dotter, MD;  Location: [facility];  Service: Orthopedics;  Laterality: Right;   VARICOCELE EXCISION  05/14/1987   Social History: Social History   Socioeconomic History   Marital status: Divorced    Spouse name: Not on file   Number of children: Not on file   Years of education: Not on file   Highest education level: Some college, no degree  Occupational History   Not on file  Tobacco Use   Smoking status: Every Day    Current packs/day: 1.00    Average packs/day: 1 pack/day for 20.0 years (20.0 ttl pk-yrs)    Types: Cigarettes    Passive exposure: Current   Smokeless tobacco: Never  Vaping Use   Vaping status: Never Used  Substance and Sexual Activity   Alcohol use: Yes    Comment: rarely   Drug use: Yes    Frequency: 1.0 times per week    Types: Marijuana   Sexual activity: Not on file  Other Topics Concern   Not on file  Social History Narrative   Not on file   Social Drivers of Health   Financial Resource Strain: Low Risk  (10/16/2023)   Overall Financial Resource Strain (CARDIA)     Difficulty of Paying Living Expenses: Not hard at all  Food Insecurity: No Food Insecurity (10/16/2023)   Hunger Vital Sign    Worried About Running Out of Food in the Last Year: Never true    Ran Out of Food in the Last Year: Never true  Transportation Needs: No Transportation Needs (10/16/2023)   PRAPARE - Administrator, Civil Service (Medical): No    Lack of Transportation (Non-Medical): No  Physical Activity: Inactive (10/23/2023)   Exercise Vital Sign    Days of Exercise per Week: 0 days    Minutes of Exercise per Session: 0 min  Stress: Stress Concern Present (10/16/2023)   [name] of Occupational Health - Occupational Stress Questionnaire    Feeling of Stress : To some extent  Social Connections: Moderately Integrated (10/16/2023)   Social Connection and Isolation Panel    Frequency of Communication with Friends and Family: More than three times a week    Frequency of Social Gatherings with Friends and Family: Once a week    Attends Religious Services: More than 4 times per year    Active Member of [employer] or Organizations: Yes    Attends Engineer, structural: More than 4 times per year    Marital Status: Divorced   Family History: History reviewed. No pertinent family history. Allergies: No  Known Allergies Medications: See med rec.  Review of Systems: No headache, visual changes, nausea, vomiting, diarrhea, constipation, dizziness, abdominal pain, skin rash, fevers, chills, night sweats, swollen lymph nodes, weight loss, chest pain, body aches, joint swelling, muscle aches, shortness of breath, mood changes, visual or auditory hallucinations.  Objective:    BP 108/78 (BP Location: Right Arm, Patient Position: Sitting, Cuff Size: Normal)   Pulse (!) 59   Ht 5' 10 (1.778 m)   Wt 217 lb 14.4 oz (98.8 kg)   SpO2 96%   BMI 31.27 kg/m   General: Well Developed, well nourished, and in no acute distress. Neuro: Alert and oriented x3, extra-ocular  muscles intact, sensation grossly intact. Cranial nerves II through XII are intact, motor, sensory, and coordinative functions are all intact. HEENT: Normocephalic, atraumatic, pupils equal round reactive to light, neck supple, no masses, no lymphadenopathy, thyroid nonpalpable. Oropharynx, nasopharynx, external ear canals are unremarkable. Skin: Warm and dry, no rashes noted.  Cardiac: Regular rate and rhythm, no murmurs rubs or gallops. Respiratory: Clear to auscultation bilaterally. Not using accessory muscles, speaking in full sentences. Abdominal: Soft, nontender, nondistended, positive bowel sounds, no masses, no organomegaly. Musculoskeletal: Shoulder, elbow, wrist, hip, knee, ankle stable, and with full range of motion.  Impression and Recommendations:    Wellness examination Assessment & Plan: Routine HCM labs reviewed. HCM reviewed/discussed. Anticipatory guidance regarding healthy weight, lifestyle and choices given. Recommend healthy diet.  Recommend approximately 150 minutes/week of moderate intensity exercise Recommend regular dental and vision exams Always use seatbelt/lap and shoulder restraints Recommend using smoke alarms and checking batteries at least twice a year Recommend using sunscreen when outside Discussed colon cancer screening recommendations, options.  Patient will complete Cologuard Discussed immunization recommendations   Tobacco use disorder Assessment & Plan: 30 pack year history, current smoker  Orders: -     CT CHEST LUNG CANCER SCREENING LOW DOSE WO CONTRAST; Future  Primary hypertension  Personal history of nicotine dependence -     CT CHEST LUNG CANCER SCREENING LOW DOSE WO CONTRAST; Future  Other orders -     amLODIPine  Besylate; Take 1 tablet (5 mg total) by mouth daily.  Dispense: 90 tablet; Refill: 1  Return in about 4 months (around 02/22/2024) for hypertension.   ___________________________________________ Vendela Troung de [place], MD, ABFM,  CAQSM Primary Care and Sports Medicine [facility]

## 2023-11-04 ENCOUNTER — Other Ambulatory Visit (HOSPITAL_BASED_OUTPATIENT_CLINIC_OR_DEPARTMENT_OTHER): Payer: Self-pay

## 2023-11-06 ENCOUNTER — Ambulatory Visit
Admission: RE | Admit: 2023-11-06 | Discharge: 2023-11-06 | Disposition: A | Discharge status: Home / Self Care | Source: Ambulatory Visit | Attending: Family Medicine | Admitting: Family Medicine

## 2023-11-06 DIAGNOSIS — Z87891 Personal history of nicotine dependence: Secondary | ICD-10-CM

## 2023-11-06 DIAGNOSIS — F17200 Nicotine dependence, unspecified, uncomplicated: Secondary | ICD-10-CM

## 2023-11-11 ENCOUNTER — Encounter (HOSPITAL_BASED_OUTPATIENT_CLINIC_OR_DEPARTMENT_OTHER): Payer: Self-pay | Admitting: *Deleted

## 2023-11-25 ENCOUNTER — Ambulatory Visit (HOSPITAL_BASED_OUTPATIENT_CLINIC_OR_DEPARTMENT_OTHER): Payer: Self-pay | Admitting: Family Medicine

## 2023-12-02 ENCOUNTER — Encounter (HOSPITAL_BASED_OUTPATIENT_CLINIC_OR_DEPARTMENT_OTHER): Payer: Self-pay

## 2023-12-02 ENCOUNTER — Other Ambulatory Visit (HOSPITAL_BASED_OUTPATIENT_CLINIC_OR_DEPARTMENT_OTHER): Payer: Self-pay | Admitting: *Deleted

## 2023-12-02 ENCOUNTER — Other Ambulatory Visit (HOSPITAL_BASED_OUTPATIENT_CLINIC_OR_DEPARTMENT_OTHER): Payer: Self-pay

## 2023-12-02 ENCOUNTER — Ambulatory Visit (HOSPITAL_BASED_OUTPATIENT_CLINIC_OR_DEPARTMENT_OTHER): Payer: PRIVATE HEALTH INSURANCE

## 2023-12-02 DIAGNOSIS — Z0000 Encounter for general adult medical examination without abnormal findings: Secondary | ICD-10-CM

## 2023-12-02 MED ORDER — ALBUTEROL SULFATE HFA 108 (90 BASE) MCG/ACT IN AERS
1.0000 | INHALATION_SPRAY | Freq: Four times a day (QID) | RESPIRATORY_TRACT | 0 refills | Status: DC | PRN
  Filled 2023-12-02: qty 6.7, 15d supply, fill #0

## 2023-12-02 NOTE — Progress Notes (Signed)
 Subjective:   Garrett Brown. is a 73 y.o. male who presents for an Initial Medicare Annual Wellness Visit.  Visit Complete: Virtual I connected with  Garrett Brown. on 12/02/23 by a audio enabled telemedicine application and verified that I am speaking with the correct person using two identifiers.  Patient Location: Home  Provider Location: Office/Clinic  I discussed the limitations of evaluation and management by telemedicine. The patient expressed understanding and agreed to proceed.  Vital Signs: Because this visit was a virtual/telehealth visit, some criteria may be missing or patient reported. Any vitals not documented were not able to be obtained and vitals that have been documented are patient reported.  Patient Medicare AWV questionnaire was completed by the patient on 12/02/23; I have confirmed that all information answered by patient is correct and no changes since this date.        Objective:    There were no vitals filed for this visit. There is no height or weight on file to calculate BMI.     03/09/2019   12:00 PM 03/05/2019    2:26 PM 02/21/2015    5:23 PM 02/10/2015   12:31 PM  Advanced Directives  Does Patient Have a Medical Advance Directive? No No No  No   Would patient like information on creating a medical advance directive? No - Patient declined Yes (MAU/Ambulatory/Procedural Areas - Information given) No - patient declined information  Yes - Educational materials given      Data saved with a previous flowsheet row definition     Current Medications (verified) Outpatient Encounter Medications as of 12/02/2023  Medication Sig   albuterol  (VENTOLIN  HFA) 108 (90 Base) MCG/ACT inhaler Inhale 1-2 puffs into the lungs every 6 (six) hours as needed for wheezing or shortness of breath.   amLODipine  (NORVASC ) 5 MG tablet Take 1 tablet (5 mg total) by mouth daily.   No facility-administered encounter medications on file as of 12/02/2023.     Allergies (verified) Patient has no known allergies.   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 Social History   Socioeconomic History   Marital status: Divorced    Spouse name: Not on file   Number of children: Not on file   Years of education: Not on file   Highest education level: Some college, no degree  Occupational History   Not on file  Tobacco Use   Smoking status: Every Day    Current packs/day: 1.00    Average packs/day: 1 pack/day for 20.0 years (20.0 ttl pk-yrs)    Types: Cigarettes    Passive exposure: Current   Smokeless tobacco: Never  Vaping Use   Vaping status: Never Used  Substance and Sexual Activity   Alcohol use: Yes    Comment: rarely   Drug use: Yes    Frequency: 1.0 times  per week    Types: Marijuana   Sexual activity: Not on file  Other Topics Concern   Not on file  Social History Narrative   Not on file   Social Drivers of Health   Financial Resource Strain: Low Risk  (10/16/2023)   Overall Financial Resource Strain (CARDIA)    Difficulty of Paying Living Expenses: Not hard at all  Food Insecurity: No Food Insecurity (10/16/2023)   Hunger Vital Sign    Worried About Running Out of Food in the Last Year: Never true    Ran Out of Food in the Last Year: Never true  Transportation Needs: No Transportation Needs (10/16/2023)    PRAPARE - Administrator, Civil Service (Medical): No    Lack of Transportation (Non-Medical): No  Physical Activity: Inactive (10/23/2023)   Exercise Vital Sign    Days of Exercise per Week: 0 days    Minutes of Exercise per Session: 0 min  Stress: Stress Concern Present (10/16/2023)   [name] of Occupational Health - Occupational Stress Questionnaire    Feeling of Stress : To some extent  Social Connections: Moderately Integrated (10/16/2023)   Social Connection and Isolation Panel    Frequency of Communication with Friends and Family: More than three times a week    Frequency of Social Gatherings with Friends and Family: Once a week    Attends Religious Services: More than 4 times per year    Active Member of [employer] or Organizations: Yes    Attends Engineer, structural: More than 4 times per year    Marital Status: Divorced    Tobacco Counseling Ready to quit: Not Answered Counseling given: Not Answered   Clinical Intake:                        Activities of Daily Living    12/01/2023   12:48 PM  In your present state of health, do you have any difficulty performing the following activities:  Hearing? 0  Vision? 0  Difficulty concentrating or making decisions? 0  Walking or climbing stairs? 0  Dressing or bathing? 0  Doing errands, shopping? 0  Preparing Food and eating ? N  Using the Toilet? N  In the past six months, have you accidently leaked urine? N  Do you have problems with loss of bowel control? N  Managing your Medications? N  Managing your Finances? N  Housekeeping or managing your Housekeeping? N    Patient Care Team: [place], Quintin PARAS, MD as PCP - General (Family Medicine) Anderson Maude ORN, MD (Inactive) as Consulting Physician (Orthopedic Surgery) [place], Quintin PARAS, MD as Consulting Physician (Family Medicine)  Indicate any recent Medical Services you may have received from other than [facility] providers in  the past year (date may be approximate).     Assessment:   This is a routine wellness examination for Garrett Brown.  Hearing/Vision screen No results found.   Goals Addressed   None    Depression Screen    10/23/2023    2:08 PM 08/27/2023    1:28 PM  PHQ 2/9 Scores  PHQ - 2 Score 0 0  PHQ- 9 Score 0 0    Fall Risk    12/01/2023   12:48 PM 10/23/2023    2:08 PM 08/27/2023    1:28 PM  Fall Risk   Falls in the past year? 1 0 0  Number falls in past yr:  0 0  Injury with Fall? 0 0 0  Risk for fall due to :  No Fall Risks No Fall Risks  Follow up  Falls evaluation completed Falls evaluation completed    MEDICARE RISK AT HOME: Medicare Risk at Home Any stairs in or around the home?: (Patient-Rptd) No If so, are there any without handrails?: (Patient-Rptd) No Home free of loose throw rugs in walkways, pet beds, electrical cords, etc?: (Patient-Rptd) Yes Adequate lighting in your home to reduce risk of falls?: (Patient-Rptd) Yes Life alert?: (Patient-Rptd) No Use of a cane, walker or w/c?: (Patient-Rptd) No Grab bars in the bathroom?: (Patient-Rptd) Yes Shower chair or bench in shower?: (Patient-Rptd) Yes Elevated toilet seat or a handicapped toilet?: (Patient-Rptd) No  TIMED UP AND GO:  Was the test performed? No    Cognitive Function:        Immunizations Immunization History  Administered Date(s) Administered   PFIZER(Purple Top)SARS-COV-2 Vaccination 08/17/2019    TDAP status: Due, Education has been provided regarding the importance of this vaccine. Advised may receive this vaccine at local pharmacy or Health Dept. Aware to provide a copy of the vaccination record if obtained from local pharmacy or Health Dept. Verbalized acceptance and understanding.  Flu Vaccine status: Declined, Education has been provided regarding the importance of this vaccine but patient still declined. Advised may receive this vaccine at local pharmacy or Health Dept. Aware to provide a copy  of the vaccination record if obtained from local pharmacy or Health Dept. Verbalized acceptance and understanding.  Pneumococcal vaccine status: Due, Education has been provided regarding the importance of this vaccine. Advised may receive this vaccine at local pharmacy or Health Dept. Aware to provide a copy of the vaccination record if obtained from local pharmacy or Health Dept. Verbalized acceptance and understanding.  Covid-19 vaccine status: Declined, Education has been provided regarding the importance of this vaccine but patient still declined. Advised may receive this vaccine at local pharmacy or Health Dept.or vaccine clinic. Aware to provide a copy of the vaccination record if obtained from local pharmacy or Health Dept. Verbalized acceptance and understanding.  Qualifies for Shingles Vaccine? Yes   Zostavax completed No   Shingrix Completed?: No.    Education has been provided regarding the importance of this vaccine. Patient has been advised to call insurance company to determine out of pocket expense if they have not yet received this vaccine. Advised may also receive vaccine at local pharmacy or Health Dept. Verbalized acceptance and understanding.  Screening Tests Health Maintenance  Topic Date Due   Hepatitis C Screening  Never done   DTaP/Tdap/Td (1 - Tdap) Never done   Pneumococcal Vaccine: 50+ Years (1 of 2 - PCV) Never done   Zoster Vaccines- Shingrix (1 of 2) Never done   Colonoscopy  Never done   COVID-19 Vaccine (2 - Pfizer risk series) 09/07/2019   INFLUENZA VACCINE  12/12/2023   Lung Cancer Screening  11/05/2024   Medicare Annual Wellness (AWV)  12/01/2024   Hepatitis B Vaccines  Aged Out   HPV VACCINES  Aged Out   Meningococcal B Vaccine  Aged Out    Health Maintenance  Health Maintenance Due  Topic Date Due   Hepatitis C Screening  Never done   DTaP/Tdap/Td (1 - Tdap) Never done   Pneumococcal Vaccine: 50+ Years (1 of 2 - PCV) Never done   Zoster  Vaccines- Shingrix (1 of 2) Never done   Colonoscopy  Never done   COVID-19 Vaccine (2 - Pfizer risk  series) 09/07/2019    Colonoscopy: Cologuard ordered Lung Cancer Screening: (Low Dose CT Chest recommended if Age 71-80 years, 20 pack-year currently smoking OR have quit w/in 15years.) does not qualify.   Lung Cancer Screening Referral: not due until 2026  Additional Screening:  Hepatitis C Screening: does qualify; Completed   Vision Screening: Recommended annual ophthalmology exams for early detection of glaucoma and other disorders of the eye. Is the patient up to date with their annual eye exam?  Yes  Who is the provider or what is the name of the office in which the patient attends annual eye exams? [facility]  If pt is not established with a provider, would they like to be referred to a provider to establish care? No .   Dental Screening: Recommended annual dental exams for proper oral hygiene    Community Resource Referral / Chronic Care Management: CRR required this visit?  No   CCM required this visit?  No    Plan:     I have personally reviewed and noted the following in the patient's chart:   Medical and social history Use of alcohol, tobacco or illicit drugs  Current medications and supplements including opioid prescriptions. Patient is not currently taking opioid prescriptions. Functional ability and status Nutritional status Physical activity Advanced directives List of other physicians Hospitalizations, surgeries, and ER visits in previous 12 months Vitals Screenings to include cognitive, depression, and falls Referrals and appointments  In addition, I have reviewed and discussed with patient certain preventive protocols, quality metrics, and best practice recommendations. A written personalized care plan for preventive services as well as general preventive health recommendations were provided to patient.     Kerri JONELLE Fuel,  CMA   12/02/2023   After Visit Summary: (MyChart) Due to this being a telephonic visit, the after visit summary with patients personalized plan was offered to patient via MyChart   Nurse Notes:  Mr. Brickey , Thank you for taking time to come for your Medicare Wellness Visit. I appreciate your ongoing commitment to your health goals. Please review the following plan we discussed and let me know if I can assist you in the future.   These are the goals we discussed:  Goals      Quit Smoking     Patient would like to quit smoking smokes a pack and a half now         This is a list of the screening recommended for you and due dates:  Health Maintenance  Topic Date Due   Hepatitis C Screening  Never done   DTaP/Tdap/Td vaccine (1 - Tdap) Never done   Pneumococcal Vaccine for age over 59 (1 of 2 - PCV) Never done   Zoster (Shingles) Vaccine (1 of 2) Never done   Colon Cancer Screening  Never done   COVID-19 Vaccine (2 - Pfizer risk series) 09/07/2019   Flu Shot  12/12/2023   Screening for Lung Cancer  11/05/2024   Medicare Annual Wellness Visit  12/01/2024   Hepatitis B Vaccine  Aged Out   HPV Vaccine  Aged Out   Meningitis B Vaccine  Aged Out

## 2023-12-02 NOTE — Patient Instructions (Signed)
 Mr. Garrett Brown , Thank you for taking time to come for your Medicare Wellness Visit. I appreciate your ongoing commitment to your health goals. Please review the following plan we discussed and let me know if I can assist you in the future.   Screening recommendations/referrals: Colonoscopy: Cologuard ordered Recommended yearly ophthalmology/optometry visit for glaucoma screening and checkup Recommended yearly dental visit for hygiene and checkup  Vaccinations: Influenza vaccine: Patient declined Pneumococcal vaccine: Information provided Tdap vaccine: Information provided Shingles vaccine: Information provided    Advanced directives: Information mailed to patient   Conditions/risks identified: Hypertension, Falls  Next appointment: 1 year  Preventive Care 33 Years and Older, Male Preventive care refers to lifestyle choices and visits with your health care provider that can promote health and wellness. What does preventive care include? A yearly physical exam. This is also called an annual well check. Dental exams once or twice a year. Routine eye exams. Ask your health care provider how often you should have your eyes checked. Personal lifestyle choices, including: Daily care of your teeth and gums. Regular physical activity. Eating a healthy diet. Avoiding tobacco and drug use. Limiting alcohol use. Practicing safe sex. Taking low doses of aspirin  every day. Taking vitamin and mineral supplements as recommended by your health care provider. What happens during an annual well check? The services and screenings done by your health care provider during your annual well check will depend on your age, overall health, lifestyle risk factors, and family history of disease. Counseling  Your health care provider may ask you questions about your: Alcohol use. Tobacco use. Drug use. Emotional well-being. Home and relationship well-being. Sexual activity. Eating habits. History of  falls. Memory and ability to understand (cognition). Work and work Astronomer. Screening  You may have the following tests or measurements: Height, weight, and BMI. Blood pressure. Lipid and cholesterol levels. These may be checked every 5 years, or more frequently if you are over 89 years old. Skin check. Lung cancer screening. You may have this screening every year starting at age 8 if you have a 30-pack-year history of smoking and currently smoke or have quit within the past 15 years. Fecal occult blood test (FOBT) of the stool. You may have this test every year starting at age 78. Flexible sigmoidoscopy or colonoscopy. You may have a sigmoidoscopy every 5 years or a colonoscopy every 10 years starting at age 35. Prostate cancer screening. Recommendations will vary depending on your family history and other risks. Hepatitis C blood test. Hepatitis B blood test. Sexually transmitted disease (STD) testing. Diabetes screening. This is done by checking your blood sugar (glucose) after you have not eaten for a while (fasting). You may have this done every 1-3 years. Abdominal aortic aneurysm (AAA) screening. You may need this if you are a current or former smoker. Osteoporosis. You may be screened starting at age 47 if you are at high risk. Talk with your health care provider about your test results, treatment options, and if necessary, the need for more tests. Vaccines  Your health care provider may recommend certain vaccines, such as: Influenza vaccine. This is recommended every year. Tetanus, diphtheria, and acellular pertussis (Tdap, Td) vaccine. You may need a Td booster every 10 years. Zoster vaccine. You may need this after age 66. Pneumococcal 13-valent conjugate (PCV13) vaccine. One dose is recommended after age 11. Pneumococcal polysaccharide (PPSV23) vaccine. One dose is recommended after age 16. Talk to your health care provider about which screenings and vaccines you need  and  how often you need them. This information is not intended to replace advice given to you by your health care provider. Make sure you discuss any questions you have with your health care provider. Document Released: 05/26/2015 Document Revised: 01/17/2016 Document Reviewed: 02/28/2015 Elsevier Interactive Patient Education  2017 [employer].  Fall Prevention in the Home Falls can cause injuries. They can happen to people of all ages. There are many things you can do to make your home safe and to help prevent falls. What can I do on the outside of my home? Regularly fix the edges of walkways and driveways and fix any cracks. Remove anything that might make you trip as you walk through a door, such as a raised step or threshold. Trim any bushes or trees on the path to your home. Use bright outdoor lighting. Clear any walking paths of anything that might make someone trip, such as rocks or tools. Regularly check to see if handrails are loose or broken. Make sure that both sides of any steps have handrails. Any raised decks and porches should have guardrails on the edges. Have any leaves, snow, or ice cleared regularly. Use sand or salt on walking paths during winter. Clean up any spills in your garage right away. This includes oil or grease spills. What can I do in the bathroom? Use night lights. Install grab bars by the toilet and in the tub and shower. Do not use towel bars as grab bars. Use non-skid mats or decals in the tub or shower. If you need to sit down in the shower, use a plastic, non-slip stool. Keep the floor dry. Clean up any water  that spills on the floor as soon as it happens. Remove soap buildup in the tub or shower regularly. Attach bath mats securely with double-sided non-slip rug tape. Do not have throw rugs and other things on the floor that can make you trip. What can I do in the bedroom? Use night lights. Make sure that you have a light by your bed that is easy to  reach. Do not use any sheets or blankets that are too big for your bed. They should not hang down onto the floor. Have a firm chair that has side arms. You can use this for support while you get dressed. Do not have throw rugs and other things on the floor that can make you trip. What can I do in the kitchen? Clean up any spills right away. Avoid walking on wet floors. Keep items that you use a lot in easy-to-reach places. If you need to reach something above you, use a strong step stool that has a grab bar. Keep electrical cords out of the way. Do not use floor polish or wax that makes floors slippery. If you must use wax, use non-skid floor wax. Do not have throw rugs and other things on the floor that can make you trip. What can I do with my stairs? Do not leave any items on the stairs. Make sure that there are handrails on both sides of the stairs and use them. Fix handrails that are broken or loose. Make sure that handrails are as long as the stairways. Check any carpeting to make sure that it is firmly attached to the stairs. Fix any carpet that is loose or worn. Avoid having throw rugs at the top or bottom of the stairs. If you do have throw rugs, attach them to the floor with carpet tape. Make sure that you  have a light switch at the top of the stairs and the bottom of the stairs. If you do not have them, ask someone to add them for you. What else can I do to help prevent falls? Wear shoes that: Do not have high heels. Have rubber bottoms. Are comfortable and fit you well. Are closed at the toe. Do not wear sandals. If you use a stepladder: Make sure that it is fully opened. Do not climb a closed stepladder. Make sure that both sides of the stepladder are locked into place. Ask someone to hold it for you, if possible. Clearly mark and make sure that you can see: Any grab bars or handrails. First and last steps. Where the edge of each step is. Use tools that help you move  around (mobility aids) if they are needed. These include: Canes. Walkers. Scooters. Crutches. Turn on the lights when you go into a dark area. Replace any light bulbs as soon as they burn out. Set up your furniture so you have a clear path. Avoid moving your furniture around. If any of your floors are uneven, fix them. If there are any pets around you, be aware of where they are. Review your medicines with your doctor. Some medicines can make you feel dizzy. This can increase your chance of falling. Ask your doctor what other things that you can do to help prevent falls. This information is not intended to replace advice given to you by your health care provider. Make sure you discuss any questions you have with your health care provider. Document Released: 02/23/2009 Document Revised: 10/05/2015 Document Reviewed: 06/03/2014 Elsevier Interactive Patient Education  2017 [employer].

## 2024-01-22 ENCOUNTER — Other Ambulatory Visit (HOSPITAL_BASED_OUTPATIENT_CLINIC_OR_DEPARTMENT_OTHER): Payer: Self-pay

## 2024-02-23 ENCOUNTER — Ambulatory Visit (INDEPENDENT_AMBULATORY_CARE_PROVIDER_SITE_OTHER): Admitting: Family Medicine

## 2024-02-23 ENCOUNTER — Other Ambulatory Visit (HOSPITAL_BASED_OUTPATIENT_CLINIC_OR_DEPARTMENT_OTHER): Payer: Self-pay

## 2024-02-23 ENCOUNTER — Other Ambulatory Visit (HOSPITAL_BASED_OUTPATIENT_CLINIC_OR_DEPARTMENT_OTHER): Payer: Self-pay | Admitting: *Deleted

## 2024-02-23 ENCOUNTER — Encounter (HOSPITAL_BASED_OUTPATIENT_CLINIC_OR_DEPARTMENT_OTHER): Payer: Self-pay | Admitting: Family Medicine

## 2024-02-23 VITALS — BP 133/74 | HR 68 | Ht 70.0 in | Wt 211.6 lb

## 2024-02-23 DIAGNOSIS — I10 Essential (primary) hypertension: Secondary | ICD-10-CM | POA: Diagnosis not present

## 2024-02-23 MED ORDER — ALBUTEROL SULFATE HFA 108 (90 BASE) MCG/ACT IN AERS: 1.0000 | INHALATION_SPRAY | Freq: Four times a day (QID) | RESPIRATORY_TRACT | 0 refills | Status: DC | PRN

## 2024-02-23 NOTE — Assessment & Plan Note (Signed)
 Blood pressure appropriate in office today.  He continues with amlodipine  as prescribed.  Denies any issues with chest pain or headaches.  Has not been checking blood pressure at home. Given appropriate blood pressure control, can continue with medications as prescribed.  No changes in medications today. Recommend intermittent monitoring of blood pressure at home, DASH diet

## 2024-02-23 NOTE — Progress Notes (Signed)
    Procedures performed today:    None.  Independent interpretation of notes and tests performed by another provider:   None.  Brief History, Exam, Impression, and Recommendations:    BP 133/74 (BP Location: Left Arm, Patient Position: Sitting, Cuff Size: Normal)   Pulse 68   Ht 5' 10 (1.778 m)   Wt 211 lb 9.6 oz (96 kg)   SpO2 99%   BMI 30.36 kg/m   Primary hypertension Assessment & Plan: Blood pressure appropriate in office today.  He continues with amlodipine  as prescribed.  Denies any issues with chest pain or headaches.  Has not been checking blood pressure at home. Given appropriate blood pressure control, can continue with medications as prescribed.  No changes in medications today. Recommend intermittent monitoring of blood pressure at home, DASH diet   He does plan to utilize nicotine replacement methods to assist with smoking cessation. He did complete lung cancer screening, no concerning findings on imaging.  Recommendation to repeat screening in 1 year  Return in about 4 months (around 06/25/2024), or if symptoms worsen or fail to improve, for hypertension.   ___________________________________________ Deshawna Mcneece [place], MD, ABFM, CAQSM Primary Care and Sports Medicine [facility]

## 2024-02-23 NOTE — Patient Instructions (Signed)
  Medication Instructions:  Your physician recommends that you continue on your current medications as directed. Please refer to the Current Medication list given to you today. --If you need a refill on any your medications before your next appointment, please call your pharmacy first. If no refills are authorized on file call the office.--   Follow-Up: Your next appointment:   Your physician recommends that you schedule a follow-up appointment in: 4 months follow up  with Dr. de [place]  You will receive a text message or e-mail with a link to a survey about your care and experience with [place] today! We would greatly appreciate your feedback!   Thanks for letting us be apart of your health journey!!  Primary Care and Sports Medicine   Dr. Ceasar Mons [place]   We encourage you to activate your patient portal called "MyChart".  Sign up information is provided on this After Visit Summary.  MyChart is used to connect with patients for Virtual Visits (Telemedicine).  Patients are able to view lab/test results, encounter notes, upcoming appointments, etc.  Non-urgent messages can be sent to your provider as well. To learn more about what you can do with MyChart, please visit --  [email or web address].

## 2024-03-16 ENCOUNTER — Other Ambulatory Visit (HOSPITAL_BASED_OUTPATIENT_CLINIC_OR_DEPARTMENT_OTHER): Payer: Self-pay | Admitting: Family Medicine

## 2024-04-20 ENCOUNTER — Other Ambulatory Visit (HOSPITAL_BASED_OUTPATIENT_CLINIC_OR_DEPARTMENT_OTHER): Payer: Self-pay | Admitting: Family Medicine

## 2024-04-20 ENCOUNTER — Other Ambulatory Visit (HOSPITAL_BASED_OUTPATIENT_CLINIC_OR_DEPARTMENT_OTHER): Payer: Self-pay

## 2024-04-20 MED ORDER — AMLODIPINE BESYLATE 5 MG PO TABS
5.0000 mg | ORAL_TABLET | Freq: Every day | ORAL | 1 refills | Status: AC
  Filled 2024-04-20: qty 90, 90d supply, fill #0

## 2024-04-20 NOTE — Telephone Encounter (Signed)
 Copied from CRM #[id number]. Topic: Clinical - Medication Refill >> Apr 20, 2024  9:37 AM Donna BRAVO wrote: Medication:  amLODipine  (NORVASC ) 5 MG tablet  Has the patient contacted their pharmacy? No (Agent: If no, request that the patient contact the pharmacy for the refill. If patient does not wish to contact the pharmacy document the reason why and proceed with request.) (Agent: If yes, when and what did the pharmacy advise?)  This is the patient's preferred pharmacy:   [facility] [address] [place] [address] Phone: [phone] Fax: [phone]  Is this the correct pharmacy for this prescription? Yes If no, delete pharmacy and type the correct one.   Has the prescription been filled recently? Yes  Is the patient out of the medication? No  patient has 1 pill left   Has the patient been seen for an appointment in the last year OR does the patient have an upcoming appointment? Yes  Can we respond through MyChart? Yes  Agent: Please be advised that Rx refills may take up to 3 business days. We ask that you follow-up with your pharmacy.

## 2024-04-21 ENCOUNTER — Other Ambulatory Visit (HOSPITAL_BASED_OUTPATIENT_CLINIC_OR_DEPARTMENT_OTHER): Payer: Self-pay

## 2024-06-28 ENCOUNTER — Ambulatory Visit (HOSPITAL_BASED_OUTPATIENT_CLINIC_OR_DEPARTMENT_OTHER): Admitting: Family Medicine
